# Patient Record
Sex: Male | Born: 1965
Health system: Southern US, Community
[De-identification: ages and names within clinical notes are randomized; demographics above are authoritative.]

## PROBLEM LIST (undated history)

## (undated) DIAGNOSIS — F329 Major depressive disorder, single episode, unspecified: Secondary | ICD-10-CM

## (undated) DIAGNOSIS — F419 Anxiety disorder, unspecified: Secondary | ICD-10-CM

## (undated) DIAGNOSIS — F429 Obsessive-compulsive disorder, unspecified: Secondary | ICD-10-CM

## (undated) DIAGNOSIS — L309 Dermatitis, unspecified: Secondary | ICD-10-CM

## (undated) DIAGNOSIS — Z87448 Personal history of other diseases of urinary system: Secondary | ICD-10-CM

## (undated) DIAGNOSIS — N529 Male erectile dysfunction, unspecified: Secondary | ICD-10-CM

## (undated) DIAGNOSIS — T7840XA Allergy, unspecified, initial encounter: Secondary | ICD-10-CM

## (undated) DIAGNOSIS — F32A Depression, unspecified: Secondary | ICD-10-CM

## (undated) HISTORY — DX: Male erectile dysfunction, unspecified: N52.9

## (undated) HISTORY — DX: Allergy, unspecified, initial encounter: T78.40XA

## (undated) HISTORY — PX: VASECTOMY: SHX75

## (undated) HISTORY — DX: Obsessive-compulsive disorder, unspecified: F42.9

## (undated) HISTORY — DX: Personal history of other diseases of urinary system: Z87.448

## (undated) HISTORY — DX: Dermatitis, unspecified: L30.9

## (undated) HISTORY — DX: Anxiety disorder, unspecified: F41.9

## (undated) HISTORY — DX: Depression, unspecified: F32.A

## (undated) HISTORY — DX: Major depressive disorder, single episode, unspecified: F32.9

---

## 1998-12-12 ENCOUNTER — Encounter: Payer: Self-pay | Admitting: Internal Medicine

## 1998-12-12 ENCOUNTER — Ambulatory Visit (HOSPITAL_COMMUNITY): Admission: RE | Admit: 1998-12-12 | Discharge: 1998-12-12 | Payer: Self-pay | Admitting: Internal Medicine

## 2000-07-16 ENCOUNTER — Emergency Department (HOSPITAL_COMMUNITY): Admission: EM | Admit: 2000-07-16 | Discharge: 2000-07-16 | Payer: Self-pay | Admitting: Emergency Medicine

## 2000-07-24 ENCOUNTER — Emergency Department (HOSPITAL_COMMUNITY): Admission: EM | Admit: 2000-07-24 | Discharge: 2000-07-24 | Payer: Self-pay | Admitting: Emergency Medicine

## 2000-08-31 ENCOUNTER — Emergency Department (HOSPITAL_COMMUNITY): Admission: EM | Admit: 2000-08-31 | Discharge: 2000-08-31 | Payer: Self-pay | Admitting: Emergency Medicine

## 2000-09-02 ENCOUNTER — Emergency Department (HOSPITAL_COMMUNITY): Admission: EM | Admit: 2000-09-02 | Discharge: 2000-09-02 | Payer: Self-pay | Admitting: Emergency Medicine

## 2002-08-22 ENCOUNTER — Emergency Department (HOSPITAL_COMMUNITY): Admission: EM | Admit: 2002-08-22 | Discharge: 2002-08-23 | Payer: Self-pay | Admitting: Emergency Medicine

## 2002-12-23 ENCOUNTER — Emergency Department (HOSPITAL_COMMUNITY): Admission: EM | Admit: 2002-12-23 | Discharge: 2002-12-23 | Payer: Self-pay | Admitting: Nurse Practitioner

## 2002-12-31 ENCOUNTER — Ambulatory Visit (HOSPITAL_BASED_OUTPATIENT_CLINIC_OR_DEPARTMENT_OTHER): Admission: RE | Admit: 2002-12-31 | Discharge: 2002-12-31 | Payer: Self-pay | Admitting: Internal Medicine

## 2004-10-18 ENCOUNTER — Ambulatory Visit: Payer: Self-pay | Admitting: Internal Medicine

## 2004-11-23 ENCOUNTER — Emergency Department: Payer: Self-pay | Admitting: Emergency Medicine

## 2005-01-08 ENCOUNTER — Ambulatory Visit: Payer: Self-pay | Admitting: Internal Medicine

## 2005-01-17 ENCOUNTER — Ambulatory Visit: Payer: Self-pay | Admitting: Internal Medicine

## 2005-01-30 ENCOUNTER — Ambulatory Visit: Payer: Self-pay | Admitting: Internal Medicine

## 2005-02-28 ENCOUNTER — Ambulatory Visit: Payer: Self-pay | Admitting: Gastroenterology

## 2005-03-16 ENCOUNTER — Ambulatory Visit: Payer: Self-pay | Admitting: Gastroenterology

## 2005-03-20 ENCOUNTER — Encounter: Admission: RE | Admit: 2005-03-20 | Discharge: 2005-03-20 | Payer: Self-pay | Admitting: Gastroenterology

## 2005-03-30 ENCOUNTER — Ambulatory Visit (HOSPITAL_COMMUNITY): Admission: RE | Admit: 2005-03-30 | Discharge: 2005-03-30 | Payer: Self-pay | Admitting: Gastroenterology

## 2005-04-17 ENCOUNTER — Ambulatory Visit: Payer: Self-pay | Admitting: Gastroenterology

## 2005-04-30 ENCOUNTER — Ambulatory Visit: Payer: Self-pay | Admitting: Internal Medicine

## 2005-08-13 ENCOUNTER — Emergency Department: Payer: Self-pay | Admitting: General Practice

## 2005-10-09 ENCOUNTER — Ambulatory Visit: Payer: Self-pay | Admitting: Internal Medicine

## 2006-08-07 ENCOUNTER — Ambulatory Visit: Payer: Self-pay | Admitting: Internal Medicine

## 2006-10-29 ENCOUNTER — Ambulatory Visit: Payer: Self-pay | Admitting: Internal Medicine

## 2006-12-23 ENCOUNTER — Ambulatory Visit: Payer: Self-pay | Admitting: Internal Medicine

## 2007-01-28 ENCOUNTER — Ambulatory Visit: Payer: Self-pay | Admitting: Internal Medicine

## 2007-03-18 ENCOUNTER — Ambulatory Visit: Payer: Self-pay | Admitting: Internal Medicine

## 2007-04-30 ENCOUNTER — Ambulatory Visit: Payer: Self-pay | Admitting: Internal Medicine

## 2007-06-06 DIAGNOSIS — F411 Generalized anxiety disorder: Secondary | ICD-10-CM | POA: Insufficient documentation

## 2007-06-06 DIAGNOSIS — G4700 Insomnia, unspecified: Secondary | ICD-10-CM | POA: Insufficient documentation

## 2007-06-06 DIAGNOSIS — F3289 Other specified depressive episodes: Secondary | ICD-10-CM | POA: Insufficient documentation

## 2007-06-06 DIAGNOSIS — F329 Major depressive disorder, single episode, unspecified: Secondary | ICD-10-CM

## 2007-06-06 DIAGNOSIS — F419 Anxiety disorder, unspecified: Secondary | ICD-10-CM | POA: Insufficient documentation

## 2007-06-06 DIAGNOSIS — F4310 Post-traumatic stress disorder, unspecified: Secondary | ICD-10-CM | POA: Insufficient documentation

## 2007-06-06 DIAGNOSIS — B009 Herpesviral infection, unspecified: Secondary | ICD-10-CM | POA: Insufficient documentation

## 2007-06-06 DIAGNOSIS — B350 Tinea barbae and tinea capitis: Secondary | ICD-10-CM | POA: Insufficient documentation

## 2007-06-06 DIAGNOSIS — K589 Irritable bowel syndrome without diarrhea: Secondary | ICD-10-CM | POA: Insufficient documentation

## 2007-12-02 ENCOUNTER — Ambulatory Visit: Payer: Self-pay | Admitting: Internal Medicine

## 2007-12-02 DIAGNOSIS — B079 Viral wart, unspecified: Secondary | ICD-10-CM | POA: Insufficient documentation

## 2007-12-02 DIAGNOSIS — M6740 Ganglion, unspecified site: Secondary | ICD-10-CM | POA: Insufficient documentation

## 2008-02-06 ENCOUNTER — Ambulatory Visit: Payer: Self-pay | Admitting: Internal Medicine

## 2008-02-06 LAB — CONVERTED CEMR LAB
ALT: 19 units/L (ref 0–53)
AST: 23 units/L (ref 0–37)
Albumin: 3.9 g/dL (ref 3.5–5.2)
Alkaline Phosphatase: 51 units/L (ref 39–117)
BUN: 8 mg/dL (ref 6–23)
Basophils Absolute: 0 10*3/uL (ref 0.0–0.1)
Basophils Relative: 0.6 % (ref 0.0–1.0)
Bilirubin Urine: NEGATIVE
Bilirubin, Direct: 0.3 mg/dL (ref 0.0–0.3)
CO2: 31 meq/L (ref 19–32)
Calcium: 9 mg/dL (ref 8.4–10.5)
Chloride: 104 meq/L (ref 96–112)
Cholesterol: 171 mg/dL (ref 0–200)
Creatinine, Ser: 0.7 mg/dL (ref 0.4–1.5)
Eosinophils Absolute: 0.1 10*3/uL (ref 0.0–0.6)
Eosinophils Relative: 3 % (ref 0.0–5.0)
GFR calc Af Amer: 158 mL/min
GFR calc non Af Amer: 131 mL/min
Glucose, Bld: 80 mg/dL (ref 70–99)
HCT: 42.5 % (ref 39.0–52.0)
HDL: 68.4 mg/dL (ref >39.0)
Hemoglobin, Urine: NEGATIVE
Hemoglobin: 14.1 g/dL (ref 13.0–17.0)
Ketones, ur: NEGATIVE mg/dL
LDL Cholesterol: 92 mg/dL (ref 0–99)
Leukocytes, UA: NEGATIVE
Lymphocytes Relative: 53.3 % — ABNORMAL HIGH (ref 12.0–46.0)
MCHC: 33.1 g/dL (ref 30.0–36.0)
MCV: 91.8 fL (ref 78.0–100.0)
Monocytes Absolute: 0.3 10*3/uL (ref 0.2–0.7)
Monocytes Relative: 9.5 % (ref 3.0–11.0)
Neutro Abs: 1.1 10*3/uL — ABNORMAL LOW (ref 1.4–7.7)
Neutrophils Relative %: 33.6 % — ABNORMAL LOW (ref 43.0–77.0)
Nitrite: NEGATIVE
PSA: 0.46 ng/mL (ref 0.10–4.00)
Platelets: 255 10*3/uL (ref 150–400)
Potassium: 3.6 meq/L (ref 3.5–5.1)
RBC: 4.63 M/uL (ref 4.22–5.81)
RDW: 12.1 % (ref 11.5–14.6)
Sodium: 140 meq/L (ref 135–145)
Specific Gravity, Urine: 1.015 (ref 1.000–1.03)
TSH: 0.79 microintl units/mL (ref 0.35–5.50)
Total Bilirubin: 3.3 mg/dL — ABNORMAL HIGH (ref 0.3–1.2)
Total CHOL/HDL Ratio: 2.5
Total Protein, Urine: NEGATIVE mg/dL
Total Protein: 7.5 g/dL (ref 6.0–8.3)
Triglycerides: 53 mg/dL (ref 0–149)
Urine Glucose: NEGATIVE mg/dL
Urobilinogen, UA: 2 (ref 0.0–1.0)
VLDL: 11 mg/dL (ref 0–40)
WBC: 3.4 10*3/uL — ABNORMAL LOW (ref 4.5–10.5)
pH: 7 (ref 5.0–8.0)

## 2008-02-09 ENCOUNTER — Ambulatory Visit: Payer: Self-pay | Admitting: Internal Medicine

## 2008-02-09 DIAGNOSIS — R17 Unspecified jaundice: Secondary | ICD-10-CM | POA: Insufficient documentation

## 2008-02-12 ENCOUNTER — Encounter: Payer: Self-pay | Admitting: Internal Medicine

## 2008-05-25 ENCOUNTER — Ambulatory Visit: Payer: Self-pay | Admitting: Internal Medicine

## 2008-07-26 ENCOUNTER — Ambulatory Visit: Payer: Self-pay | Admitting: Internal Medicine

## 2008-11-02 ENCOUNTER — Ambulatory Visit: Payer: Self-pay | Admitting: Internal Medicine

## 2008-11-02 DIAGNOSIS — L259 Unspecified contact dermatitis, unspecified cause: Secondary | ICD-10-CM | POA: Insufficient documentation

## 2009-04-12 ENCOUNTER — Ambulatory Visit: Payer: Self-pay | Admitting: Internal Medicine

## 2009-07-15 IMAGING — CR DG CHEST 2V
2 series · 2 of 2 positions shown · non-contrast
Comparison: None

CLINICAL DATA: Wellness visit, cough, nonsmoker

CHEST - 2 VIEW

[view not recorded (1 of 2)]
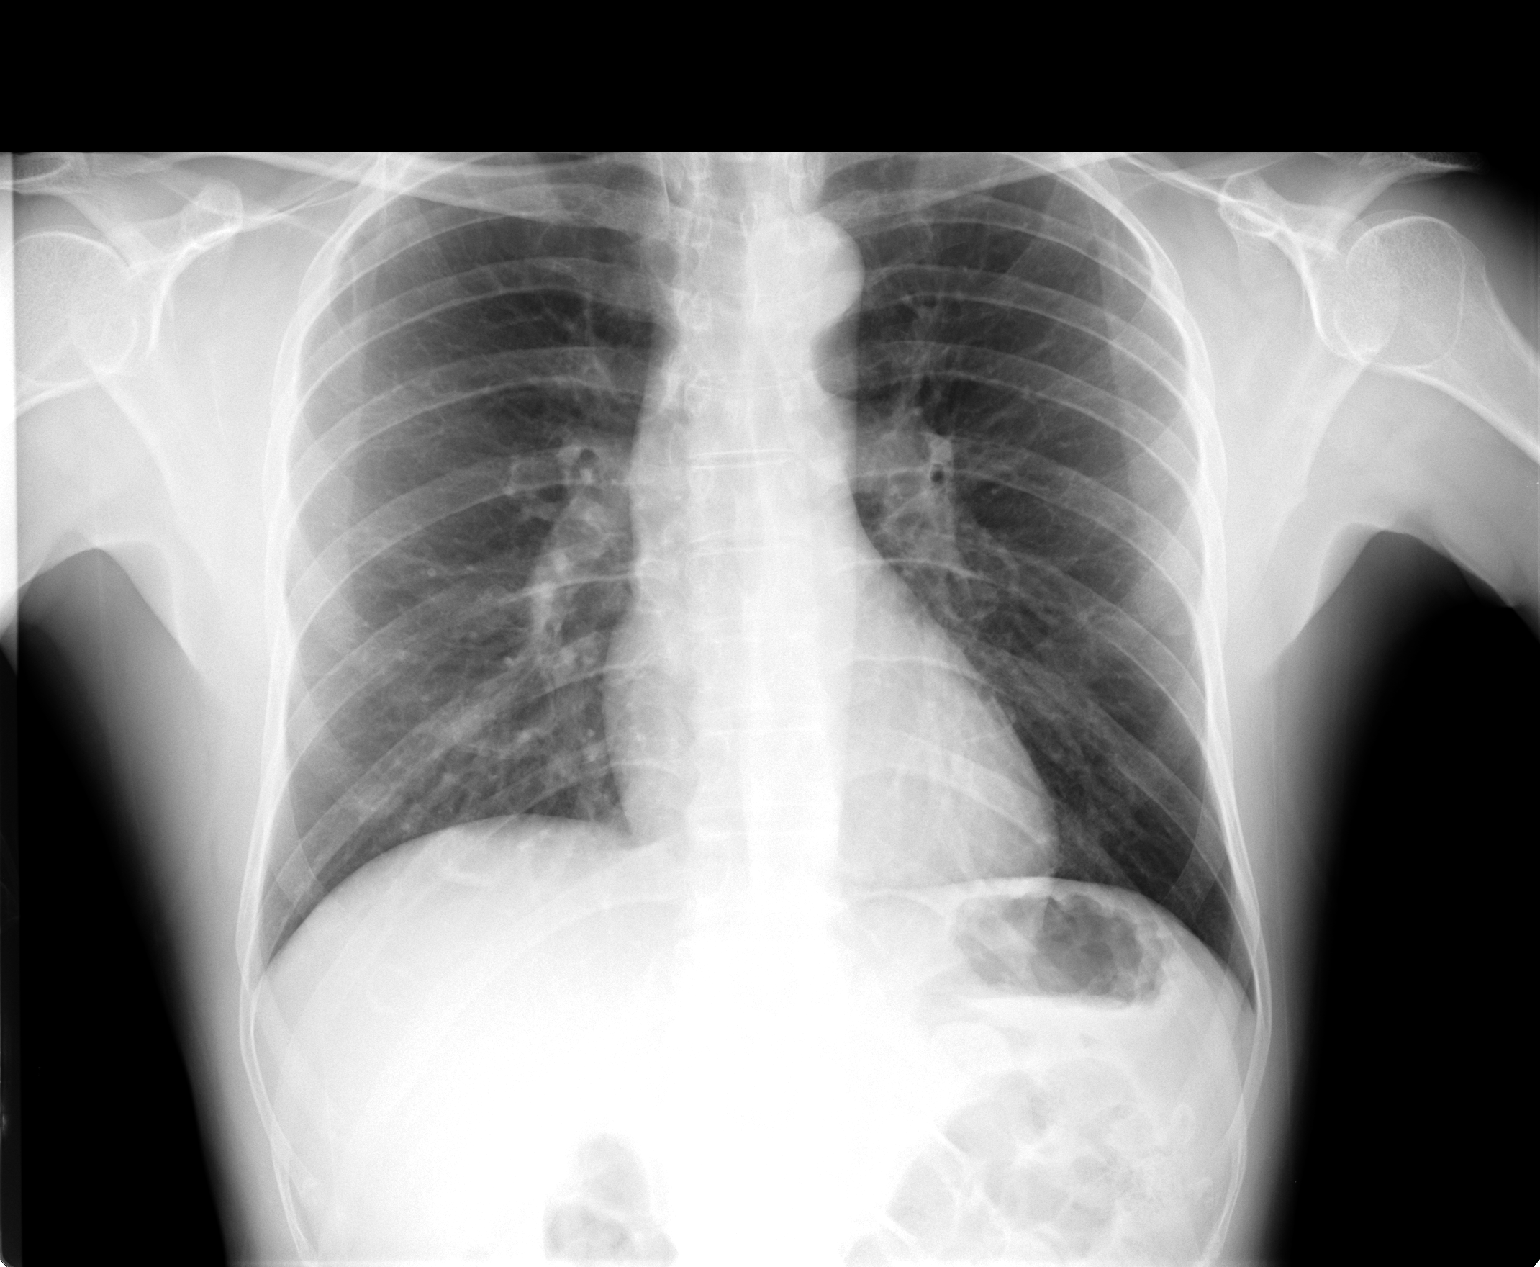

[view not recorded (2 of 2)]
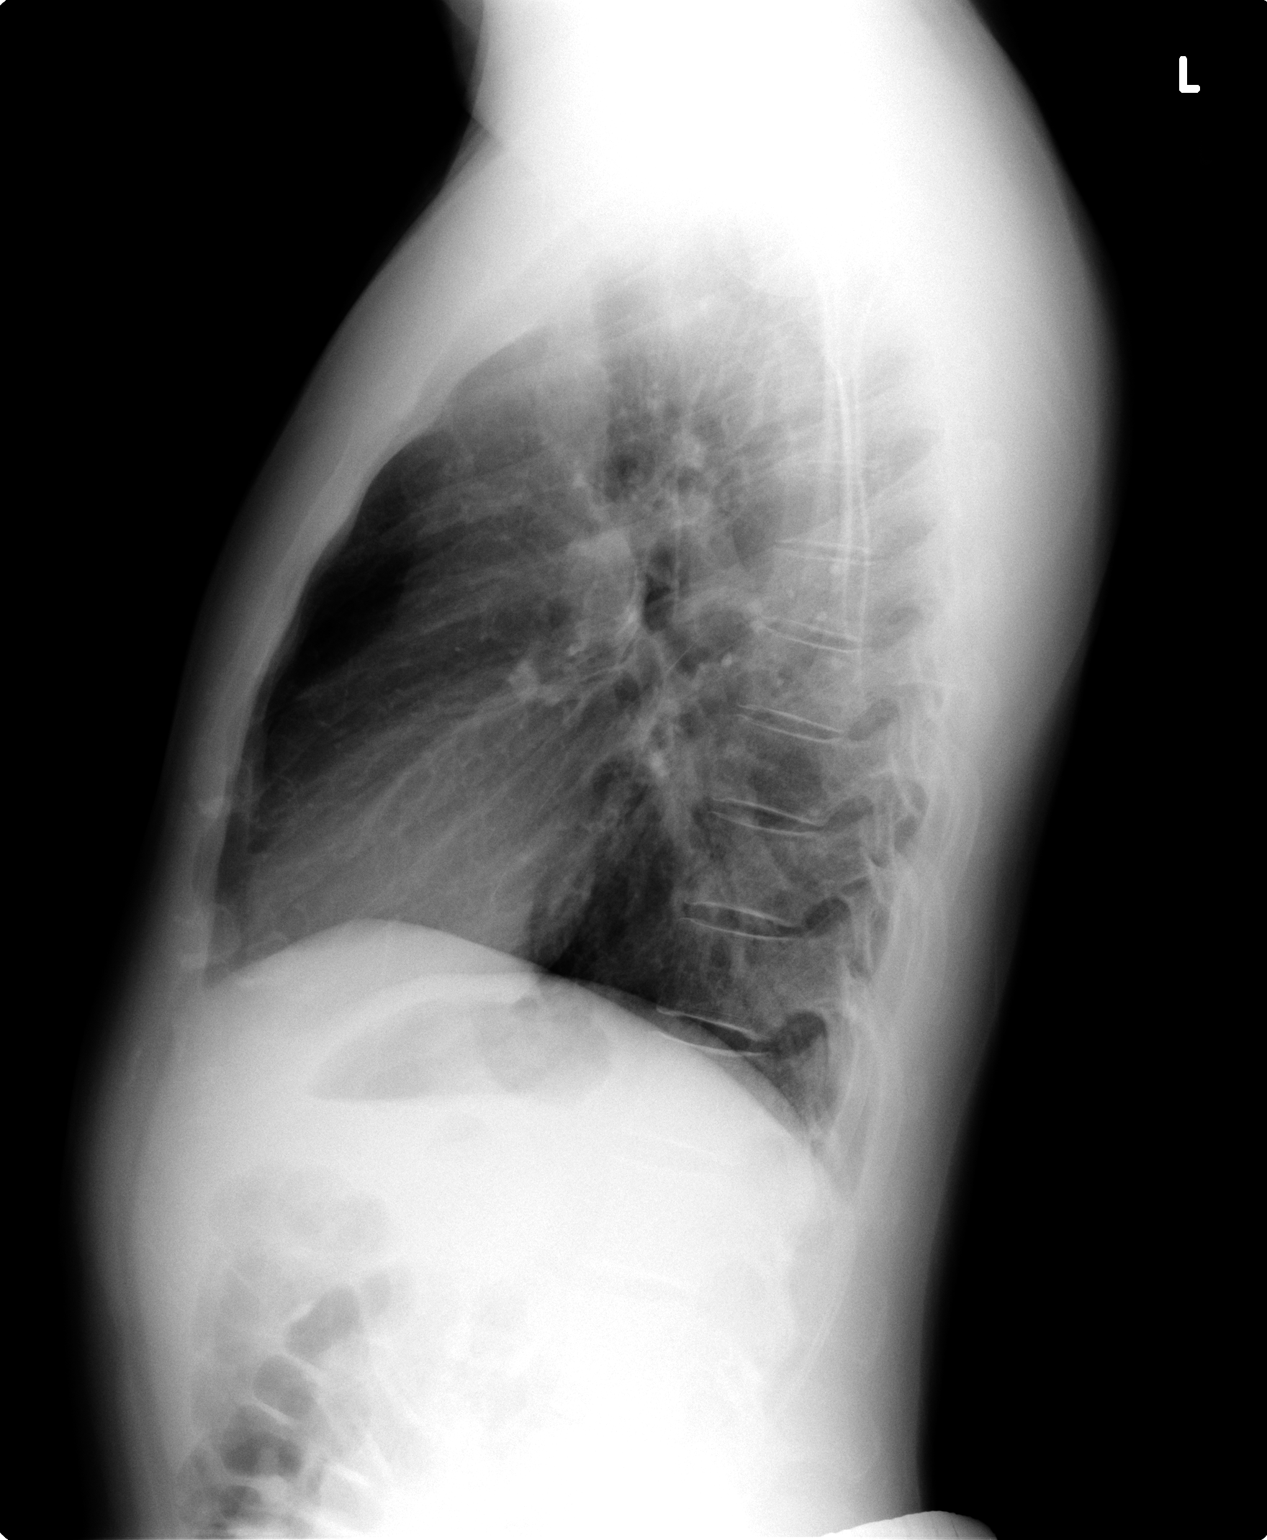

[2 of 2 positions shown; findings below may reference images not displayed]

FINDINGS: The cardiomediastinal silhouette is unremarkable.  There
is no acute infiltrate or pleural effusion.  Bony structures are
unremarkable.
IMPRESSION: No active chest disease.

## 2010-03-08 ENCOUNTER — Ambulatory Visit: Payer: Self-pay | Admitting: Internal Medicine

## 2010-03-08 DIAGNOSIS — J309 Allergic rhinitis, unspecified: Secondary | ICD-10-CM | POA: Insufficient documentation

## 2010-03-14 ENCOUNTER — Encounter: Payer: Self-pay | Admitting: Internal Medicine

## 2010-07-10 ENCOUNTER — Ambulatory Visit: Payer: Self-pay | Admitting: Internal Medicine

## 2010-07-12 LAB — CONVERTED CEMR LAB
ALT: 14 units/L (ref 0–53)
AST: 19 units/L (ref 0–37)
Albumin: 4.3 g/dL (ref 3.5–5.2)
Alkaline Phosphatase: 57 units/L (ref 39–117)
BUN: 13 mg/dL (ref 6–23)
Basophils Absolute: 0 10*3/uL (ref 0.0–0.1)
Basophils Relative: 0.7 % (ref 0.0–3.0)
Bilirubin Urine: NEGATIVE
Bilirubin, Direct: 0.3 mg/dL (ref 0.0–0.3)
CO2: 30 meq/L (ref 19–32)
Calcium: 9.2 mg/dL (ref 8.4–10.5)
Chloride: 102 meq/L (ref 96–112)
Cholesterol: 180 mg/dL (ref 0–200)
Creatinine, Ser: 0.8 mg/dL (ref 0.4–1.5)
Eosinophils Absolute: 0.3 10*3/uL (ref 0.0–0.7)
Eosinophils Relative: 6 % — ABNORMAL HIGH (ref 0.0–5.0)
GFR calc non Af Amer: 139.66 mL/min (ref >60)
Glucose, Bld: 75 mg/dL (ref 70–99)
HCT: 40.1 % (ref 39.0–52.0)
HDL: 74.4 mg/dL (ref >39.00)
Hemoglobin, Urine: NEGATIVE
Hemoglobin: 13.5 g/dL (ref 13.0–17.0)
Ketones, ur: NEGATIVE mg/dL
LDL Cholesterol: 82 mg/dL (ref 0–99)
Leukocytes, UA: NEGATIVE
Lymphocytes Relative: 43.4 % (ref 12.0–46.0)
Lymphs Abs: 2.2 10*3/uL (ref 0.7–4.0)
MCHC: 33.7 g/dL (ref 30.0–36.0)
MCV: 95 fL (ref 78.0–100.0)
Monocytes Absolute: 0.4 10*3/uL (ref 0.1–1.0)
Monocytes Relative: 8.6 % (ref 3.0–12.0)
Neutro Abs: 2 10*3/uL (ref 1.4–7.7)
Neutrophils Relative %: 41.3 % — ABNORMAL LOW (ref 43.0–77.0)
Nitrite: NEGATIVE
PSA: 0.48 ng/mL (ref 0.10–4.00)
Platelets: 228 10*3/uL (ref 150.0–400.0)
Potassium: 3.8 meq/L (ref 3.5–5.1)
RBC: 4.22 M/uL (ref 4.22–5.81)
RDW: 12.3 % (ref 11.5–14.6)
Sodium: 140 meq/L (ref 135–145)
Specific Gravity, Urine: >=1.03 (ref 1.000–1.030)
TSH: 0.96 microintl units/mL (ref 0.35–5.50)
Testosterone: 412.57 ng/dL (ref 350.00–890.00)
Total Bilirubin: 2.1 mg/dL — ABNORMAL HIGH (ref 0.3–1.2)
Total CHOL/HDL Ratio: 2
Total Protein, Urine: NEGATIVE mg/dL
Total Protein: 7.4 g/dL (ref 6.0–8.3)
Triglycerides: 118 mg/dL (ref 0.0–149.0)
Urine Glucose: NEGATIVE mg/dL
Urobilinogen, UA: 1 (ref 0.0–1.0)
VLDL: 23.6 mg/dL (ref 0.0–40.0)
WBC: 5 10*3/uL (ref 4.5–10.5)
pH: 6 (ref 5.0–8.0)

## 2010-08-15 ENCOUNTER — Ambulatory Visit: Payer: Self-pay | Admitting: Internal Medicine

## 2010-08-15 DIAGNOSIS — R319 Hematuria, unspecified: Secondary | ICD-10-CM | POA: Insufficient documentation

## 2010-08-16 LAB — CONVERTED CEMR LAB
Bilirubin Urine: NEGATIVE
Hemoglobin, Urine: NEGATIVE
Ketones, ur: NEGATIVE mg/dL
Leukocytes, UA: NEGATIVE
Nitrite: NEGATIVE
Specific Gravity, Urine: 1.02 (ref 1.000–1.030)
Total Protein, Urine: NEGATIVE mg/dL
Urine Glucose: NEGATIVE mg/dL
Urobilinogen, UA: 4 (ref 0.0–1.0)
pH: 7 (ref 5.0–8.0)

## 2010-09-06 ENCOUNTER — Encounter: Payer: Self-pay | Admitting: Internal Medicine

## 2010-12-03 ENCOUNTER — Encounter: Payer: Self-pay | Admitting: Gastroenterology

## 2010-12-14 NOTE — Consult Note (Signed)
Summary: [facility]  [facility]   Imported By: Sherian Rein 03/24/2010 08:23:42  _____________________________________________________________________  External Attachment:    Type:   Image     Comment:   External Document

## 2010-12-14 NOTE — Assessment & Plan Note (Signed)
Summary: cpx-lb   Vital Signs:  Patient profile:   45 year old male Height:      66 inches Weight:      134 pounds BMI:     21.71 O2 Sat:      97 % on Room air Temp:     98.4 degrees F oral Pulse rate:   66 / minute BP sitting:   102 / 78  (left arm) Cuff size:   regular  Vitals Entered By: Bill Salinas CMA (July 10, 2010 3:15 PM)  O2 Flow:  Room air CC: cpx/ab   CC:  cpx/ab.  History of Present Illness: The patient presents for a preventive health examination   Current Medications (verified): 1)  Lorazepam 0.5 Mg Tabs (Lorazepam) .Marland Kitchen.. 1 By Mouth Two Times A Day As Needed Anxiety 2)  Vitamin D3 1000 Unit  Tabs (Cholecalciferol) .Marland Kitchen.. 1 By Mouth Daily 3)  Denavir 1 % Crea (Penciclovir) .... Use 5 Times A Day X 5 D Prn 4)  Triamcinolone 0.5% Cream in Eucerin Lotion 1:10 .... Use Two Times A Day Prn 5)  Viagra 50 Mg Tabs (Sildenafil Citrate) .... 1/2-1 By Mouth Once Daily Prn  Allergies (verified): 1)  ! Paxil 2)  ! Aspirin (Aspirin) 3)  Levitra (Vardenafil Hcl)  Past History:  Past Medical History: Last updated: 03/08/2010 Anxiety Depression/OCD Eczema Allergic rhinitis ED  Family History: Last updated: 02/09/2008 Prostate CA  Past Surgical History: Denies surgical history  Family History: Reviewed history from 02/09/2008 and no changes required. Prostate CA  Social History: Reviewed history from 04/12/2009 and no changes required. Married, separated 2010; divorcing in Oct 2011  2 children Never Smoked Alcohol use-no Occupation:  Review of Systems  The patient denies anorexia, fever, weight loss, weight gain, vision loss, decreased hearing, hoarseness, chest pain, syncope, dyspnea on exertion, peripheral edema, prolonged cough, headaches, hemoptysis, abdominal pain, melena, hematochezia, severe indigestion/heartburn, hematuria, incontinence, genital sores, muscle weakness, suspicious skin lesions, transient blindness, difficulty walking, depression,  unusual weight change, abnormal bleeding, enlarged lymph nodes, angioedema, and testicular masses.         mild ED  Physical Exam  General:  Well-developed,well-nourished,in no acute distress; alert and cooperative throughout examination. No body odor present Head:  Normocephalic and atraumatic without obvious abnormalities. No apparent alopecia or balding. Ears:  wax B Nose:  External nasal examination shows no deformity or inflammation. Nasal mucosa are pink and moist without lesions or exudates. Mouth:  Oral mucosa and oropharynx without lesions or exudates.  Teeth in good repair. Neck:  No deformities, masses, or tenderness noted. Lungs:  Normal respiratory effort, chest expands symmetrically. Lungs are clear to auscultation, no crackles or wheezes. Heart:  Normal rate and regular rhythm. S1 and S2 normal without gallop, murmur, click, rub or other extra sounds. Abdomen:  Bowel sounds positive,abdomen soft and non-tender without masses, organomegaly or hernias noted. Genitalia:  Testes bilaterally descended without nodularity, tenderness or masses. No scrotal masses or lesions. No penis lesions or urethral discharge. Msk:  No deformity or scoliosis noted of thoracic or lumbar spine.   Neurologic:  No cranial nerve deficits noted. Station and gait are normal. Plantar reflexes are down-going bilaterally. DTRs are symmetrical throughout. Sensory, motor and coordinative functions appear intact. Skin:  dry with eczem. patches Cervical Nodes:  No lymphadenopathy noted Psych:  Oriented X3, subdued, and slightly anxious.  Not suicidal or homicidal   Impression & Recommendations:  Problem # 1:  WELL ADULT EXAM (ICD-V70.0) Assessment New Health and  age related issues were discussed. Available screening tests and vaccinations were discussed as well. Healthy life style including good diet and exercise was discussed.  Orders: TLB-BMP (Basic Metabolic Panel-BMET) (80048-METABOL) TLB-CBC Platelet  - w/Differential (85025-CBCD) TLB-Hepatic/Liver Function Pnl (80076-HEPATIC) TLB-Lipid Panel (80061-LIPID) TLB-TSH (Thyroid Stimulating Hormone) (84443-TSH) TLB-Udip ONLY (81003-UDIP) TLB-PSA (Prostate Specific Antigen) (84153-PSA)  Problem # 2:  ANXIETY (ICD-300.00) Assessment: Unchanged  His updated medication list for this problem includes:    Lorazepam 0.5 Mg Tabs (Lorazepam) .Marland Kitchen... 1 by mouth two times a day as needed anxiety  Problem # 3:  ERECTILE DYSFUNCTION (ICD-607.84) mild Assessment: Unchanged  His updated medication list for this problem includes:    Viagra 50 Mg Tabs (Sildenafil citrate) .Marland Kitchen... 1/2-1 by mouth once daily prn  Orders: TLB-Testosterone, Total (84403-TESTO)  Complete Medication List: 1)  Lorazepam 0.5 Mg Tabs (Lorazepam) .Marland Kitchen.. 1 by mouth two times a day as needed anxiety 2)  Vitamin D3 1000 Unit Tabs (Cholecalciferol) .Marland Kitchen.. 1 by mouth daily 3)  Denavir 1 % Crea (Penciclovir) .... Use 5 times a day x 5 d prn 4)  Triamcinolone 0.5% Cream in Eucerin Lotion 1:10  .... Use two times a day prn 5)  Viagra 50 Mg Tabs (Sildenafil citrate) .... 1/2-1 by mouth once daily prn  Other Orders: Admin 1st Vaccine (16109) Flu Vaccine 31yrs + (60454)  Patient Instructions: 1)  Please schedule a follow-up appointment in 6 months. Prescriptions: VITAMIN D3 1000 UNIT  TABS (CHOLECALCIFEROL) 1 by mouth daily  #100 x 3   Entered and Authorized by:   Tresa Garter MD   Signed by:   Tresa Garter MD on 07/10/2010   Method used:   Print then Give to Patient   RxID:   [id number] VIAGRA 50 MG TABS (SILDENAFIL CITRATE) 1/2-1 by mouth once daily prn  #12 x 3   Entered and Authorized by:   Tresa Garter MD   Signed by:   Tresa Garter MD on 07/10/2010   Method used:   Print then Give to Patient   RxID:   [id number] TRIAMCINOLONE 0.5% CREAM IN EUCERIN LOTION 1:10 use two times a day prn  #500 g x 3   Entered and Authorized by:   Tresa Garter MD   Signed by:   Tresa Garter MD on 07/10/2010   Method used:   Print then Give to Patient   RxID:   [id number] DENAVIR 1 % CREA (PENCICLOVIR) use 5 times a day x 5 d prn  #15 g x 3   Entered and Authorized by:   Tresa Garter MD   Signed by:   Tresa Garter MD on 07/10/2010   Method used:   Print then Give to Patient   RxID:   [id number] LORAZEPAM 0.5 MG TABS (LORAZEPAM) 1 by mouth two times a day as needed anxiety  #60 x 3   Entered and Authorized by:   Tresa Garter MD   Signed by:   Tresa Garter MD on 07/10/2010   Method used:   Print then Give to Patient   RxID:   [id number]    Preventive Care Screening  Last Tetanus Booster:    Date:  01/11/2004    Results:  Historical       Flu Vaccine Consent Questions     Do you have a history of severe allergic reactions to this vaccine? no    Any prior history of allergic reactions to egg and/or gelatin?  no    Do you have a sensitivity to the preservative Thimersol? no    Do you have a past history of Guillan-Barre Syndrome? no    Do you currently have an acute febrile illness? no    Have you ever had a severe reaction to latex? no    Vaccine information given and explained to patient? yes    Are you currently pregnant? no    Lot Number:[id number]   Exp Date:05/12/2011   Site Given  Left Deltoid IMflu

## 2010-12-14 NOTE — Letter (Signed)
Summary: [facility]  [facility]   Imported By: Lennie Odor 09/27/2010 11:35:57  _____________________________________________________________________  External Attachment:    Type:   Image     Comment:   External Document

## 2010-12-14 NOTE — Assessment & Plan Note (Signed)
Summary: BLADDER INFECTION? /NWS #   Vital Signs:  Patient profile:   45 year old male Height:      66 inches Weight:      135 pounds BMI:     21.87 Temp:     99.0 degrees F oral Pulse rate:   84 / minute Pulse rhythm:   regular BP sitting:   110 / 80  (left arm) Cuff size:   regular  Vitals Entered By: Lanier Prude, Beverly Gust) (August 15, 2010 4:09 PM) CC: hematuria, abd pain x 1 day Is Patient Diabetic? No Comments pt states he has not filled any of the RX's he was given in august   CC:  hematuria and abd pain x 1 day.  History of Present Illness: C/o blood in urine x 2 yesterday.  Last week he had pain in back. 2 wks ago had n/v  Current Medications (verified): 1)  Lorazepam 0.5 Mg Tabs (Lorazepam) .Marland Kitchen.. 1 By Mouth Two Times A Day As Needed Anxiety 2)  Vitamin D3 1000 Unit  Tabs (Cholecalciferol) .Marland Kitchen.. 1 By Mouth Daily 3)  Denavir 1 % Crea (Penciclovir) .... Use 5 Times A Day X 5 D Prn 4)  Triamcinolone 0.5% Cream in Eucerin Lotion 1:10 .... Use Two Times A Day Prn 5)  Viagra 50 Mg Tabs (Sildenafil Citrate) .... 1/2-1 By Mouth Once Daily Prn  Allergies (verified): 1)  ! Paxil 2)  ! Aspirin (Aspirin) 3)  Levitra (Vardenafil Hcl)  Past History:  Social History: Last updated: 07/10/2010 Married, separated 2010; divorcing in Oct 2011  2 children Never Smoked Alcohol use-no Occupation:  Past Medical History: Anxiety Depression/OCD Eczema Allergic rhinitis ED H/o hematuria  Review of Systems  The patient denies fever, abdominal pain, and genital sores.    Physical Exam  General:  Well-developed,well-nourished,in no acute distress; alert and cooperative throughout examination. No body odor present Mouth:  Oral mucosa and oropharynx without lesions or exudates.  Teeth in good repair. Lungs:  Normal respiratory effort, chest expands symmetrically. Lungs are clear to auscultation, no crackles or wheezes. Heart:  Normal rate and regular rhythm. S1 and S2  normal without gallop, murmur, click, rub or other extra sounds. Abdomen:  Bowel sounds positive,abdomen soft and non-tender without masses, organomegaly or hernias noted. Msk:  No deformity or scoliosis noted of thoracic or lumbar spine.   Neurologic:  No cranial nerve deficits noted. Station and gait are normal. Plantar reflexes are down-going bilaterally. DTRs are symmetrical throughout. Sensory, motor and coordinative functions appear intact.   Impression & Recommendations:  Problem # 1:  HEMATURIA UNSPECIFIED (ICD-599.70), macro Assessment New  Orders: TLB-Udip ONLY (81003-UDIP) Urology Referral (Urology)  His updated medication list for this problem includes:    Ciprofloxacin Hcl 500 Mg Tabs (Ciprofloxacin hcl) .Marland Kitchen... 1 by mouth bid  Complete Medication List: 1)  Lorazepam 0.5 Mg Tabs (Lorazepam) .Marland Kitchen.. 1 by mouth two times a day as needed anxiety 2)  Vitamin D3 1000 Unit Tabs (Cholecalciferol) .Marland Kitchen.. 1 by mouth daily 3)  Denavir 1 % Crea (Penciclovir) .... Use 5 times a day x 5 d prn 4)  Triamcinolone 0.5% Cream in Eucerin Lotion 1:10  .... Use two times a day prn 5)  Viagra 50 Mg Tabs (Sildenafil citrate) .... 1/2-1 by mouth once daily prn 6)  Ciprofloxacin Hcl 500 Mg Tabs (Ciprofloxacin hcl) .Marland Kitchen.. 1 by mouth bid  Patient Instructions: 1)  Call if you are not better in a reasonable amount of time or if worse. Go  to ER if feeling really bad!  Prescriptions: CIPROFLOXACIN HCL 500 MG TABS (CIPROFLOXACIN HCL) 1 by mouth bid  #20 x 1   Entered and Authorized by:   Tresa Garter MD   Signed by:   Tresa Garter MD on 08/15/2010   Method used:   Print then Give to Patient   RxID:   [id number]

## 2010-12-14 NOTE — Assessment & Plan Note (Signed)
Summary: HEAD ACHES--NAUSEA-SINUS PROBLEM-REFUSED ANOTHER MD--STC   Vital Signs:  Patient profile:   45 year old male Height:      66 inches Weight:      131 pounds BMI:     21.22 O2 Sat:      96 % on Room air Temp:     98.3 degrees F oral Pulse rate:   88 / minute BP sitting:   110 / 60  (left arm) Cuff size:   regular  Vitals Entered By: Lucious Groves (March 08, 2010 3:22 PM)  O2 Flow:  Room air CC: C/o headache, nausea, and mucous producing sinus issues "for awhile now"./kb Is Patient Diabetic? No Pain Assessment Patient in pain? no        CC:  C/o headache, nausea, and and mucous producing sinus issues "for awhile now"./kb.  History of Present Illness: C/o sinus congestion, drainage, bad breath C/o mild ED - soft erection at times  Current Medications (verified): 1)  Lorazepam 0.5 Mg Tabs (Lorazepam) .Marland Kitchen.. 1 By Mouth Two Times A Day As Needed Anxiety 2)  Vitamin D3 1000 Unit  Tabs (Cholecalciferol) .Marland Kitchen.. 1 By Mouth Daily 3)  Denavir 1 % Crea (Penciclovir) .... Use 5 Times A Day X 5 D Prn 4)  Triamcinolone 0.5% Cream in Eucerin Lotion 1:10 .... Use Two Times A Day Prn 5)  Paxil Cr 12.5 Mg Xr24h-Tab (Paroxetine Hcl) .Marland Kitchen.. 1 By Mouth Qd  Allergies (verified): 1)  ! Paxil 2)  ! Aspirin (Aspirin) 3)  Levitra (Vardenafil Hcl)  Past History:  Family History: Last updated: 02/09/2008 Prostate CA  Social History: Last updated: 04/12/2009 Married, separated 2010;  2 children Never Smoked Alcohol use-no Occupation:  Past Medical History: Anxiety Depression/OCD Eczema Allergic rhinitis ED  Review of Systems       The patient complains of depression.  The patient denies fever, dyspnea on exertion, peripheral edema, and abdominal pain.    Physical Exam  General:  Well-developed,well-nourished,in no acute distress; alert and cooperative throughout examination. No body odor present Head:  Normocephalic and atraumatic without obvious abnormalities. No apparent  alopecia or balding. Ears:  wax B Nose:  External nasal examination shows no deformity or inflammation. Nasal mucosa are pink and moist without lesions or exudates. Mouth:  Oral mucosa and oropharynx without lesions or exudates.  Teeth in good repair. Lungs:  Normal respiratory effort, chest expands symmetrically. Lungs are clear to auscultation, no crackles or wheezes. Heart:  Normal rate and regular rhythm. S1 and S2 normal without gallop, murmur, click, rub or other extra sounds. Abdomen:  Bowel sounds positive,abdomen soft and non-tender without masses, organomegaly or hernias noted. Genitalia:  Testes bilaterally descended without nodularity, tenderness or masses. No scrotal masses or lesions. No penis lesions or urethral discharge. Msk:  No deformity or scoliosis noted of thoracic or lumbar spine.   Neurologic:  No cranial nerve deficits noted. Station and gait are normal. Plantar reflexes are down-going bilaterally. DTRs are symmetrical throughout. Sensory, motor and coordinative functions appear intact. Skin:  dry with eczem. patches Psych:  Oriented X3, subdued, and slightly anxious.  Not suicidal or homicidal   Impression & Recommendations:  Problem # 1:  HERPES SIMPLEX INFECTION (ICD-054.9) Assessment Comment Only On Rx prn  Problem # 2:  ANXIETY (ICD-300.00)/OCD Assessment: Unchanged  The following medications were removed from the medication list:    Paxil Cr 12.5 Mg Xr24h-tab (Paroxetine hcl) .Marland Kitchen... 1 by mouth qd His updated medication list for this problem includes:  Lorazepam 0.5 Mg Tabs (Lorazepam) .Marland Kitchen... 1 by mouth two times a day as needed anxiety  Problem # 3:  ERECTILE DYSFUNCTION (ICD-607.84) mild Assessment: Unchanged  His updated medication list for this problem includes:    Viagra 50 Mg Tabs (Sildenafil citrate) ...Marland KitchenMarland Kitchen1/4 - 1/2-1 by mouth once daily as needed  Problem # 4:  CERUMEN IMPACTION (ICD-380.4) Assessment: New Removed w/a loop  Problem # 5:   ALLERGIC RHINITIS (ICD-477.9) Assessment: Deteriorated  Orders: ENT Referral (ENT)  Complete Medication List: 1)  Lorazepam 0.5 Mg Tabs (Lorazepam) .Marland Kitchen.. 1 by mouth two times a day as needed anxiety 2)  Vitamin D3 1000 Unit Tabs (Cholecalciferol) .Marland Kitchen.. 1 by mouth daily 3)  Denavir 1 % Crea (Penciclovir) .... Use 5 times a day x 5 d prn 4)  Triamcinolone 0.5% Cream in Eucerin Lotion 1:10  .... Use two times a day prn 5)  Viagra 50 Mg Tabs (Sildenafil citrate) .... 1/2-1 by mouth once daily prn  Patient Instructions: 1)  Use the Sinus rinse as needed 2)  Please schedule a follow-up appointment in 4 months well w/labs v70.0. Prescriptions: VIAGRA 50 MG TABS (SILDENAFIL CITRATE) 1/2-1 by mouth once daily prn  #12 x 3   Entered and Authorized by:   Tresa Garter MD   Signed by:   Tresa Garter MD on 03/08/2010   Method used:   Print then Give to Patient   RxID:   [id number] PAXIL CR 12.5 MG XR24H-TAB (PAROXETINE HCL) 1 by mouth qd  #30 x 6   Entered and Authorized by:   Tresa Garter MD   Signed by:   Tresa Garter MD on 03/08/2010   Method used:   Print then Give to Patient   RxID:   [id number] DENAVIR 1 % CREA (PENCICLOVIR) use 5 times a day x 5 d prn  #15 g x 3   Entered and Authorized by:   Tresa Garter MD   Signed by:   Tresa Garter MD on 03/08/2010   Method used:   Print then Give to Patient   RxID:   [id number] LORAZEPAM 0.5 MG TABS (LORAZEPAM) 1 by mouth two times a day as needed anxiety  #60 x 3   Entered and Authorized by:   Tresa Garter MD   Signed by:   Tresa Garter MD on 03/08/2010   Method used:   Print then Give to Patient   RxID:   [id number]

## 2011-03-09 ENCOUNTER — Encounter: Payer: Self-pay | Admitting: Internal Medicine

## 2011-03-09 ENCOUNTER — Ambulatory Visit (INDEPENDENT_AMBULATORY_CARE_PROVIDER_SITE_OTHER): Payer: Managed Care, Other (non HMO) | Admitting: Internal Medicine

## 2011-03-09 DIAGNOSIS — H6120 Impacted cerumen, unspecified ear: Secondary | ICD-10-CM

## 2011-03-09 DIAGNOSIS — J019 Acute sinusitis, unspecified: Secondary | ICD-10-CM

## 2011-03-09 DIAGNOSIS — J329 Chronic sinusitis, unspecified: Secondary | ICD-10-CM | POA: Insufficient documentation

## 2011-03-09 DIAGNOSIS — G43909 Migraine, unspecified, not intractable, without status migrainosus: Secondary | ICD-10-CM

## 2011-03-09 MED ORDER — IBUPROFEN 600 MG PO TABS: ORAL_TABLET | ORAL | Status: AC

## 2011-03-09 MED ORDER — RIZATRIPTAN BENZOATE 10 MG PO TABS: 10.0000 mg | ORAL_TABLET | Freq: Once | ORAL | Status: DC | PRN

## 2011-03-09 MED ORDER — AMOXICILLIN 500 MG PO CAPS: 1000.0000 mg | ORAL_CAPSULE | Freq: Two times a day (BID) | ORAL | Status: AC

## 2011-03-09 NOTE — Assessment & Plan Note (Signed)
Amoxicillin x10d 

## 2011-03-09 NOTE — Progress Notes (Signed)
  Subjective:    Patient ID: George [name], male    DOB: July 26, 1966, 45 y.o.   MRN: [id number]  HPI  C/o HAs x 2 wks and congestion. No n/v C/o ear wax C/o migraines  Review of Systems  Constitutional: Negative for chills and fatigue.  HENT: Positive for rhinorrhea and postnasal drip. Negative for neck pain.   Eyes: Negative for discharge.  Respiratory: Negative for chest tightness.   Cardiovascular: Negative for chest pain.  Genitourinary: Negative for frequency.  Musculoskeletal: Negative for back pain.  Neurological: Positive for headaches.  Psychiatric/Behavioral: Negative for suicidal ideas.       Objective:   Physical Exam  Constitutional: He is oriented to person, place, and time. He appears well-developed.  HENT:  Mouth/Throat: Oropharynx is clear and moist.       Swollen nasal mucosa Wax B  Eyes: Conjunctivae are normal. Pupils are equal, round, and reactive to light.  Neck: Normal range of motion. No JVD present. No thyromegaly present.  Cardiovascular: Normal rate, regular rhythm, normal heart sounds and intact distal pulses.  Exam reveals no gallop and no friction rub.   No murmur heard. Pulmonary/Chest: Effort normal and breath sounds normal. No respiratory distress. He has no wheezes. He has no rales. He exhibits no tenderness.  Abdominal: Soft. Bowel sounds are normal. He exhibits no distension and no mass. There is no tenderness. There is no rebound and no guarding.  Musculoskeletal: Normal range of motion. He exhibits no edema and no tenderness.  Lymphadenopathy:    He has no cervical adenopathy.  Neurological: He is alert and oriented to person, place, and time. He has normal reflexes. No cranial nerve deficit. He exhibits normal muscle tone. Coordination normal.  Skin: Skin is warm and dry. No rash noted.  Psychiatric: He has a normal mood and affect. His behavior is normal. Judgment and thought content normal.          Assessment & Plan:  CERUMEN  IMPACTION I cleaned B ears manually  Sinusitis acute Amoxicillin x 10 d  Migraine headache Ibuprofen Maxalt prn    OCD - discussed

## 2011-03-09 NOTE — Assessment & Plan Note (Signed)
I cleaned B ears manually

## 2011-03-09 NOTE — Assessment & Plan Note (Signed)
Ibuprofen Maxalt prn
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
## 2011-07-23 ENCOUNTER — Telehealth: Payer: Self-pay | Admitting: *Deleted

## 2011-07-23 DIAGNOSIS — Z0000 Encounter for general adult medical examination without abnormal findings: Secondary | ICD-10-CM

## 2011-07-23 NOTE — Telephone Encounter (Signed)
[facility] 

## 2011-08-07 ENCOUNTER — Other Ambulatory Visit (INDEPENDENT_AMBULATORY_CARE_PROVIDER_SITE_OTHER): Payer: Managed Care, Other (non HMO)

## 2011-08-07 ENCOUNTER — Ambulatory Visit (INDEPENDENT_AMBULATORY_CARE_PROVIDER_SITE_OTHER): Payer: Managed Care, Other (non HMO) | Admitting: Internal Medicine

## 2011-08-07 ENCOUNTER — Encounter: Payer: Self-pay | Admitting: Internal Medicine

## 2011-08-07 VITALS — BP 100/60 | HR 80 | Temp 99.0°F | Resp 16 | Wt 139.0 lb

## 2011-08-07 DIAGNOSIS — R112 Nausea with vomiting, unspecified: Secondary | ICD-10-CM

## 2011-08-07 DIAGNOSIS — Z23 Encounter for immunization: Secondary | ICD-10-CM

## 2011-08-07 DIAGNOSIS — G43909 Migraine, unspecified, not intractable, without status migrainosus: Secondary | ICD-10-CM

## 2011-08-07 DIAGNOSIS — J019 Acute sinusitis, unspecified: Secondary | ICD-10-CM

## 2011-08-07 DIAGNOSIS — J3489 Other specified disorders of nose and nasal sinuses: Secondary | ICD-10-CM

## 2011-08-07 DIAGNOSIS — Z0000 Encounter for general adult medical examination without abnormal findings: Secondary | ICD-10-CM

## 2011-08-07 DIAGNOSIS — F411 Generalized anxiety disorder: Secondary | ICD-10-CM

## 2011-08-07 LAB — BASIC METABOLIC PANEL
BUN: 14 mg/dL (ref 6–23)
CO2: 29 mEq/L (ref 19–32)
Chloride: 104 mEq/L (ref 96–112)
Glucose, Bld: 83 mg/dL (ref 70–99)
Potassium: 3.9 mEq/L (ref 3.5–5.1)

## 2011-08-07 LAB — CBC WITH DIFFERENTIAL/PLATELET
Basophils Absolute: 0 10*3/uL (ref 0.0–0.1)
Basophils Relative: 0.7 % (ref 0.0–3.0)
Eosinophils Absolute: 0.2 10*3/uL (ref 0.0–0.7)
Lymphocytes Relative: 41.4 % (ref 12.0–46.0)
MCHC: 32.8 g/dL (ref 30.0–36.0)
Neutrophils Relative %: 42.6 % — ABNORMAL LOW (ref 43.0–77.0)
RBC: 4.1 Mil/uL — ABNORMAL LOW (ref 4.22–5.81)
RDW: 12.9 % (ref 11.5–14.6)

## 2011-08-07 LAB — HEPATIC FUNCTION PANEL
Alkaline Phosphatase: 62 U/L (ref 39–117)
Bilirubin, Direct: 0.2 mg/dL (ref 0.0–0.3)
Total Bilirubin: 1.6 mg/dL — ABNORMAL HIGH (ref 0.3–1.2)

## 2011-08-07 LAB — LIPID PANEL
Cholesterol: 153 mg/dL (ref 0–200)
LDL Cholesterol: 69 mg/dL (ref 0–99)
Triglycerides: 92 mg/dL (ref 0.0–149.0)
VLDL: 18.4 mg/dL (ref 0.0–40.0)

## 2011-08-07 LAB — URINALYSIS, ROUTINE W REFLEX MICROSCOPIC
Bilirubin Urine: NEGATIVE
Ketones, ur: NEGATIVE
Total Protein, Urine: NEGATIVE
pH: 6 (ref 5.0–8.0)

## 2011-08-07 MED ORDER — AMOXICILLIN-POT CLAVULANATE 875-125 MG PO TABS: 1.0000 | ORAL_TABLET | Freq: Two times a day (BID) | ORAL | Status: AC

## 2011-08-07 MED ORDER — FLUTICASONE PROPIONATE 50 MCG/ACT NA SUSP: 2.0000 | Freq: Every day | NASAL | Status: DC

## 2011-08-07 MED ORDER — ELETRIPTAN HYDROBROMIDE 40 MG PO TABS: 40.0000 mg | ORAL_TABLET | ORAL | Status: DC | PRN

## 2011-08-07 MED ORDER — METHYLPREDNISOLONE ACETATE 80 MG/ML IJ SUSP
120.0000 mg | Freq: Once | INTRAMUSCULAR | Status: AC
  Administered 2011-08-07: 120 mg via INTRAMUSCULAR

## 2011-08-07 NOTE — Progress Notes (Signed)
  Subjective:    Patient ID: George [name], male    DOB: 11/15/1965, 45 y.o.   MRN: [id number]  HPI  C/o nasal congestion, sinus d/c, yellow d/c, nausea x 2-3 mo Sinus meds did not help much  Review of Systems  Constitutional: Negative for appetite change, fatigue and unexpected weight change.  HENT: Positive for rhinorrhea and sinus pressure. Negative for nosebleeds, congestion, sore throat, sneezing, trouble swallowing and neck pain.   Eyes: Negative for itching and visual disturbance.  Respiratory: Negative for cough.   Cardiovascular: Negative for chest pain, palpitations and leg swelling.  Gastrointestinal: Negative for nausea, diarrhea, blood in stool and abdominal distention.  Genitourinary: Negative for frequency and hematuria.  Musculoskeletal: Negative for back pain, joint swelling and gait problem.  Skin: Negative for rash.  Neurological: Negative for dizziness, tremors, speech difficulty and weakness.  Psychiatric/Behavioral: Negative for sleep disturbance, dysphoric mood and agitation. The patient is nervous/anxious.        Objective:   Physical Exam  Constitutional: He is oriented to person, place, and time. He appears well-developed.  HENT:  Mouth/Throat: Oropharynx is clear and moist.  Eyes: Conjunctivae are normal. Pupils are equal, round, and reactive to light.  Neck: Normal range of motion. No JVD present. No thyromegaly present.  Cardiovascular: Normal rate, regular rhythm, normal heart sounds and intact distal pulses.  Exam reveals no gallop and no friction rub.   No murmur heard. Pulmonary/Chest: Effort normal and breath sounds normal. No respiratory distress. He has no wheezes. He has no rales. He exhibits no tenderness.  Abdominal: Soft. Bowel sounds are normal. He exhibits no distension and no mass. There is no tenderness. There is no rebound and no guarding.  Musculoskeletal: Normal range of motion. He exhibits no edema and no tenderness.    Lymphadenopathy:    He has no cervical adenopathy.  Neurological: He is alert and oriented to person, place, and time. He has normal reflexes. No cranial nerve deficit. He exhibits normal muscle tone. Coordination normal.  Skin: Skin is warm and dry. No rash noted.  Psychiatric: His behavior is normal. Judgment and thought content normal.       anxious    Procedure Note :    Procedure :   Point of care (POC) sonography examination   Indication: sinus congestion   Equipment used: Sonosite M-Turbo with HFL38x/13-6 MHz transducer linear probe. The images were stored in the unit and later transferred in storage.  The patient was placed in a sitting position.  This study revealed no fluid in B max. sinuses   Impression: B max sinuses are not filled w/fluid         Assessment & Plan:

## 2011-08-07 NOTE — Assessment & Plan Note (Addendum)
Depo 120 mg Will try Augmentin

## 2011-08-07 NOTE — Assessment & Plan Note (Signed)
Flonase

## 2011-08-07 NOTE — Assessment & Plan Note (Signed)
Relpax prn 

## 2011-08-22 ENCOUNTER — Ambulatory Visit (INDEPENDENT_AMBULATORY_CARE_PROVIDER_SITE_OTHER): Payer: Managed Care, Other (non HMO) | Admitting: Internal Medicine

## 2011-08-22 ENCOUNTER — Encounter: Payer: Self-pay | Admitting: Internal Medicine

## 2011-08-22 VITALS — BP 100/70 | HR 72 | Temp 98.9°F | Resp 16 | Wt 140.0 lb

## 2011-08-22 DIAGNOSIS — Z0000 Encounter for general adult medical examination without abnormal findings: Secondary | ICD-10-CM

## 2011-08-22 DIAGNOSIS — Z136 Encounter for screening for cardiovascular disorders: Secondary | ICD-10-CM

## 2011-08-22 DIAGNOSIS — R7989 Other specified abnormal findings of blood chemistry: Secondary | ICD-10-CM

## 2011-08-22 NOTE — Assessment & Plan Note (Signed)
We discussed age appropriate health related issues, including available/recomended screening tests and vaccinations. We discussed a need for adhering to healthy diet and exercise. Labs/EKG were reviewed/ordered. All questions were answered.   

## 2011-08-22 NOTE — Assessment & Plan Note (Signed)
Repeat CBC in 6 mo

## 2011-08-22 NOTE — Progress Notes (Signed)
  Subjective:    Patient ID: George [name], male    DOB: 07/12/1966, 45 y.o.   MRN: [id number]  HPI  The patient is here for a wellness exam. The patient has been doing well overall without major physical or psychological issues going on lately. Sinuses are better ( he did not finish abx)  Review of Systems  Constitutional: Negative for appetite change, fatigue and unexpected weight change.  HENT: Negative for nosebleeds, congestion, sore throat, sneezing, trouble swallowing and neck pain.   Eyes: Negative for itching and visual disturbance.  Respiratory: Negative for cough.   Cardiovascular: Negative for chest pain, palpitations and leg swelling.  Gastrointestinal: Negative for nausea, diarrhea, blood in stool and abdominal distention.  Genitourinary: Negative for frequency and hematuria.  Musculoskeletal: Negative for back pain, joint swelling and gait problem.  Skin: Negative for rash.  Neurological: Negative for dizziness, tremors, speech difficulty and weakness.  Psychiatric/Behavioral: Negative for sleep disturbance, dysphoric mood and agitation. The patient is not nervous/anxious.        Objective:   Physical Exam  Constitutional: He is oriented to person, place, and time. He appears well-developed.  HENT:  Mouth/Throat: Oropharynx is clear and moist.  Eyes: Conjunctivae are normal. Pupils are equal, round, and reactive to light.  Neck: Normal range of motion. No JVD present. No thyromegaly present.  Cardiovascular: Normal rate, regular rhythm, normal heart sounds and intact distal pulses.  Exam reveals no gallop and no friction rub.   No murmur heard. Pulmonary/Chest: Effort normal and breath sounds normal. No respiratory distress. He has no wheezes. He has no rales. He exhibits no tenderness.  Abdominal: Soft. Bowel sounds are normal. He exhibits no distension and no mass. There is no tenderness. There is no rebound and no guarding.  Genitourinary: Penis normal.   Testicles are ok  Musculoskeletal: Normal range of motion. He exhibits no edema and no tenderness.  Lymphadenopathy:    He has no cervical adenopathy.  Neurological: He is alert and oriented to person, place, and time. He has normal reflexes. No cranial nerve deficit. He exhibits normal muscle tone. Coordination normal.  Skin: Skin is warm and dry. No rash noted.  Psychiatric: His behavior is normal. Judgment and thought content normal.       anxious    Wt Readings from Last 3 Encounters:  08/22/11 140 lb (63.504 kg)  08/07/11 139 lb (63.05 kg)  03/09/11 135 lb (61.236 kg)   Lab Results  Component Value Date   WBC 3.7* 08/07/2011   HGB 12.7* 08/07/2011   HCT 38.8* 08/07/2011   PLT 209.0 08/07/2011   GLUCOSE 83 08/07/2011   CHOL 153 08/07/2011   TRIG 92.0 08/07/2011   HDL 65.90 08/07/2011   LDLCALC 69 08/07/2011   ALT 13 08/07/2011   AST 16 08/07/2011   NA 139 08/07/2011   K 3.9 08/07/2011   CL 104 08/07/2011   CREATININE 0.9 08/07/2011   BUN 14 08/07/2011   CO2 29 08/07/2011   TSH 0.89 08/07/2011   PSA 0.48 07/10/2010    EKG - no change     Assessment & Plan:

## 2012-02-20 ENCOUNTER — Encounter: Payer: Self-pay | Admitting: Internal Medicine

## 2012-02-20 ENCOUNTER — Ambulatory Visit (INDEPENDENT_AMBULATORY_CARE_PROVIDER_SITE_OTHER): Payer: Managed Care, Other (non HMO) | Admitting: Internal Medicine

## 2012-02-20 ENCOUNTER — Other Ambulatory Visit (INDEPENDENT_AMBULATORY_CARE_PROVIDER_SITE_OTHER): Payer: Managed Care, Other (non HMO)

## 2012-02-20 DIAGNOSIS — F3289 Other specified depressive episodes: Secondary | ICD-10-CM

## 2012-02-20 DIAGNOSIS — G43909 Migraine, unspecified, not intractable, without status migrainosus: Secondary | ICD-10-CM

## 2012-02-20 DIAGNOSIS — F329 Major depressive disorder, single episode, unspecified: Secondary | ICD-10-CM

## 2012-02-20 DIAGNOSIS — R7989 Other specified abnormal findings of blood chemistry: Secondary | ICD-10-CM

## 2012-02-20 DIAGNOSIS — F411 Generalized anxiety disorder: Secondary | ICD-10-CM

## 2012-02-20 LAB — CBC WITH DIFFERENTIAL/PLATELET
Basophils Absolute: 0 10*3/uL (ref 0.0–0.1)
Basophils Relative: 0.6 % (ref 0.0–3.0)
Eosinophils Absolute: 0.4 10*3/uL (ref 0.0–0.7)
Hemoglobin: 13.1 g/dL (ref 13.0–17.0)
MCHC: 33.5 g/dL (ref 30.0–36.0)
MCV: 93 fl (ref 78.0–100.0)
Monocytes Absolute: 0.5 10*3/uL (ref 0.1–1.0)
Neutro Abs: 2.3 10*3/uL (ref 1.4–7.7)
Neutrophils Relative %: 47.8 % (ref 43.0–77.0)
RBC: 4.19 Mil/uL — ABNORMAL LOW (ref 4.22–5.81)
RDW: 13.2 % (ref 11.5–14.6)

## 2012-02-20 LAB — BASIC METABOLIC PANEL
CO2: 27 mEq/L (ref 19–32)
GFR: 110.17 mL/min (ref >60.00)
Glucose, Bld: 87 mg/dL (ref 70–99)
Potassium: 4.1 mEq/L (ref 3.5–5.1)
Sodium: 140 mEq/L (ref 135–145)

## 2012-02-20 NOTE — Assessment & Plan Note (Signed)
Chronic w/OCD traits - better

## 2012-02-20 NOTE — Assessment & Plan Note (Signed)
Relpax prn 

## 2012-02-20 NOTE — Progress Notes (Signed)
Patient ID: George [name], male   DOB: 1966/05/20, 46 y.o.   MRN: [id number]  Subjective:    Patient ID: George [name], male    DOB: 1966-02-20, 46 y.o.   MRN: [id number]  HPI  F/u migraine HAs, OCD, rhinitis. The patient has been doing well overall without major physical or psychological issues going on lately. Sinuses are better ( he did not finish abx)  BP Readings from Last 3 Encounters:  02/20/12 100/70  08/22/11 100/70  08/07/11 100/60    Review of Systems  Constitutional: Negative for appetite change, fatigue and unexpected weight change.  HENT: Negative for nosebleeds, congestion, sore throat, sneezing, trouble swallowing and neck pain.   Eyes: Negative for itching and visual disturbance.  Respiratory: Negative for cough.   Cardiovascular: Negative for chest pain, palpitations and leg swelling.  Gastrointestinal: Negative for nausea, diarrhea, blood in stool and abdominal distention.  Genitourinary: Negative for frequency and hematuria.  Musculoskeletal: Negative for back pain, joint swelling and gait problem.  Skin: Negative for rash.  Neurological: Negative for dizziness, tremors, speech difficulty and weakness.  Psychiatric/Behavioral: Negative for sleep disturbance, dysphoric mood and agitation. The patient is not nervous/anxious.        Objective:   Physical Exam  Constitutional: He is oriented to person, place, and time. He appears well-developed.  HENT:  Mouth/Throat: Oropharynx is clear and moist.  Eyes: Conjunctivae are normal. Pupils are equal, round, and reactive to light.  Neck: Normal range of motion. No JVD present. No thyromegaly present.  Cardiovascular: Normal rate, regular rhythm, normal heart sounds and intact distal pulses.  Exam reveals no gallop and no friction rub.   No murmur heard. Pulmonary/Chest: Effort normal and breath sounds normal. No respiratory distress. He has no wheezes. He has no rales. He exhibits no tenderness.  Abdominal: Soft.  Bowel sounds are normal. He exhibits no distension and no mass. There is no tenderness. There is no rebound and no guarding.  Genitourinary: Penis normal.       Testicles are ok  Musculoskeletal: Normal range of motion. He exhibits no edema and no tenderness.  Lymphadenopathy:    He has no cervical adenopathy.  Neurological: He is alert and oriented to person, place, and time. He has normal reflexes. No cranial nerve deficit. He exhibits normal muscle tone. Coordination normal.  Skin: Skin is warm and dry. No rash noted.  Psychiatric: His behavior is normal. Judgment and thought content normal.       anxious    Wt Readings from Last 3 Encounters:  02/20/12 139 lb (63.05 kg)  08/22/11 140 lb (63.504 kg)  08/07/11 139 lb (63.05 kg)   Lab Results  Component Value Date   WBC 3.7* 08/07/2011   HGB 12.7* 08/07/2011   HCT 38.8* 08/07/2011   PLT 209.0 08/07/2011   GLUCOSE 83 08/07/2011   CHOL 153 08/07/2011   TRIG 92.0 08/07/2011   HDL 65.90 08/07/2011   LDLCALC 69 08/07/2011   ALT 13 08/07/2011   AST 16 08/07/2011   NA 139 08/07/2011   K 3.9 08/07/2011   CL 104 08/07/2011   CREATININE 0.9 08/07/2011   BUN 14 08/07/2011   CO2 29 08/07/2011   TSH 0.89 08/07/2011   PSA 0.48 07/10/2010         Assessment & Plan:

## 2012-02-20 NOTE — Assessment & Plan Note (Signed)
Check labs 

## 2012-02-20 NOTE — Assessment & Plan Note (Signed)
Chronic w/OCD traits - doing well

## 2012-02-21 ENCOUNTER — Telehealth: Payer: Self-pay | Admitting: *Deleted

## 2012-02-21 DIAGNOSIS — Z0000 Encounter for general adult medical examination without abnormal findings: Secondary | ICD-10-CM

## 2012-02-21 DIAGNOSIS — Z125 Encounter for screening for malignant neoplasm of prostate: Secondary | ICD-10-CM

## 2012-02-21 NOTE — Telephone Encounter (Signed)
Received staff msg pt made cpx need labs entered in epic... 02/21/12@11 :30am/LMB

## 2012-06-09 ENCOUNTER — Ambulatory Visit (INDEPENDENT_AMBULATORY_CARE_PROVIDER_SITE_OTHER): Payer: Managed Care, Other (non HMO) | Admitting: Internal Medicine

## 2012-06-09 ENCOUNTER — Other Ambulatory Visit (INDEPENDENT_AMBULATORY_CARE_PROVIDER_SITE_OTHER): Payer: Managed Care, Other (non HMO)

## 2012-06-09 ENCOUNTER — Encounter: Payer: Self-pay | Admitting: Internal Medicine

## 2012-06-09 VITALS — BP 102/70 | HR 72 | Temp 97.8°F | Resp 16 | Wt 145.0 lb

## 2012-06-09 DIAGNOSIS — M545 Low back pain, unspecified: Secondary | ICD-10-CM

## 2012-06-09 DIAGNOSIS — G43909 Migraine, unspecified, not intractable, without status migrainosus: Secondary | ICD-10-CM

## 2012-06-09 DIAGNOSIS — N529 Male erectile dysfunction, unspecified: Secondary | ICD-10-CM

## 2012-06-09 DIAGNOSIS — B009 Herpesviral infection, unspecified: Secondary | ICD-10-CM

## 2012-06-09 DIAGNOSIS — B001 Herpesviral vesicular dermatitis: Secondary | ICD-10-CM | POA: Insufficient documentation

## 2012-06-09 LAB — URINALYSIS
Bilirubin Urine: NEGATIVE
Hgb urine dipstick: NEGATIVE
Ketones, ur: NEGATIVE
Urine Glucose: NEGATIVE
Urobilinogen, UA: 4 (ref 0.0–1.0)

## 2012-06-09 MED ORDER — ELETRIPTAN HYDROBROMIDE 40 MG PO TABS: 40.0000 mg | ORAL_TABLET | ORAL | Status: DC | PRN

## 2012-06-09 MED ORDER — PENCICLOVIR 1 % EX CREA: TOPICAL_CREAM | CUTANEOUS | Status: AC

## 2012-06-09 MED ORDER — SILDENAFIL CITRATE 50 MG PO TABS: 25.0000 mg | ORAL_TABLET | Freq: Every day | ORAL | Status: DC | PRN

## 2012-06-09 NOTE — Assessment & Plan Note (Signed)
Continue with current prescription therapy as reflected on the Med list.  

## 2012-06-09 NOTE — Assessment & Plan Note (Signed)
denavir topical

## 2012-06-09 NOTE — Assessment & Plan Note (Signed)
Urol cons - he'll make an appt himself

## 2012-06-09 NOTE — Progress Notes (Signed)
   Subjective:    Patient ID: George [name], male    DOB: 05/29/1966, 46 y.o.   MRN: [id number]  HPI  F/u migraine HAs, OCD, rhinitis. The patient has been doing well overall without major physical or psychological issues going on lately  C/o R LBP C/o a cold sore on L lowe lip C/o ED - he will see a urologist  BP Readings from Last 3 Encounters:  06/09/12 102/70  02/20/12 100/70  08/22/11 100/70   Wt Readings from Last 3 Encounters:  06/09/12 145 lb (65.772 kg)  02/20/12 139 lb (63.05 kg)  08/22/11 140 lb (63.504 kg)     Review of Systems  Constitutional: Negative for appetite change, fatigue and unexpected weight change.  HENT: Negative for nosebleeds, congestion, sore throat, sneezing, trouble swallowing and neck pain.   Eyes: Negative for itching and visual disturbance.  Respiratory: Negative for cough.   Cardiovascular: Negative for chest pain, palpitations and leg swelling.  Gastrointestinal: Negative for nausea, diarrhea, blood in stool and abdominal distention.  Genitourinary: Negative for frequency and hematuria.  Musculoskeletal: Negative for back pain, joint swelling and gait problem.  Skin: Negative for rash.  Neurological: Negative for dizziness, tremors, speech difficulty and weakness.  Psychiatric/Behavioral: Negative for disturbed wake/sleep cycle, dysphoric mood and agitation. The patient is not nervous/anxious.        Objective:   Physical Exam  Constitutional: He is oriented to person, place, and time. He appears well-developed.  HENT:  Mouth/Throat: Oropharynx is clear and moist.  Eyes: Conjunctivae are normal. Pupils are equal, round, and reactive to light.  Neck: Normal range of motion. No JVD present. No thyromegaly present.  Cardiovascular: Normal rate, regular rhythm, normal heart sounds and intact distal pulses.  Exam reveals no gallop and no friction rub.   No murmur heard. Pulmonary/Chest: Effort normal and breath sounds normal. No  respiratory distress. He has no wheezes. He has no rales. He exhibits no tenderness.  Abdominal: Soft. Bowel sounds are normal. He exhibits no distension and no mass. There is no tenderness. There is no rebound and no guarding.  Genitourinary: Penis normal.       Testicles are ok  Musculoskeletal: Normal range of motion. He exhibits no edema and no tenderness.  Lymphadenopathy:    He has no cervical adenopathy.  Neurological: He is alert and oriented to person, place, and time. He has normal reflexes. No cranial nerve deficit. He exhibits normal muscle tone. Coordination normal.  Skin: Skin is warm and dry. No rash noted.  Psychiatric: His behavior is normal. Judgment and thought content normal.       anxious  cold sore L lower lip  Wt Readings from Last 3 Encounters:  06/09/12 145 lb (65.772 kg)  02/20/12 139 lb (63.05 kg)  08/22/11 140 lb (63.504 kg)   Lab Results  Component Value Date   WBC 4.9 02/20/2012   HGB 13.1 02/20/2012   HCT 38.9* 02/20/2012   PLT 241.0 02/20/2012   GLUCOSE 87 02/20/2012   CHOL 153 08/07/2011   TRIG 92.0 08/07/2011   HDL 65.90 08/07/2011   LDLCALC 69 08/07/2011   ALT 13 08/07/2011   AST 16 08/07/2011   NA 140 02/20/2012   K 4.1 02/20/2012   CL 104 02/20/2012   CREATININE 0.9 02/20/2012   BUN 17 02/20/2012   CO2 27 02/20/2012   TSH 0.89 08/07/2011   PSA 0.48 07/10/2010         Assessment & Plan:

## 2012-08-29 ENCOUNTER — Other Ambulatory Visit (INDEPENDENT_AMBULATORY_CARE_PROVIDER_SITE_OTHER): Payer: Managed Care, Other (non HMO)

## 2012-08-29 DIAGNOSIS — R7989 Other specified abnormal findings of blood chemistry: Secondary | ICD-10-CM

## 2012-08-29 LAB — BASIC METABOLIC PANEL
BUN: 12 mg/dL (ref 6–23)
Creatinine, Ser: 0.7 mg/dL (ref 0.4–1.5)
GFR: 162.49 mL/min (ref >60.00)
Potassium: 3.5 mEq/L (ref 3.5–5.1)

## 2012-08-29 LAB — CBC WITH DIFFERENTIAL/PLATELET
Basophils Relative: 0.9 % (ref 0.0–3.0)
Eosinophils Absolute: 0.2 10*3/uL (ref 0.0–0.7)
MCHC: 32.6 g/dL (ref 30.0–36.0)
MCV: 94.4 fl (ref 78.0–100.0)
Monocytes Absolute: 0.3 10*3/uL (ref 0.1–1.0)
Neutrophils Relative %: 37.2 % — ABNORMAL LOW (ref 43.0–77.0)
RBC: 4.2 Mil/uL — ABNORMAL LOW (ref 4.22–5.81)
RDW: 12.5 % (ref 11.5–14.6)

## 2012-09-01 ENCOUNTER — Ambulatory Visit (INDEPENDENT_AMBULATORY_CARE_PROVIDER_SITE_OTHER): Payer: Managed Care, Other (non HMO) | Admitting: Internal Medicine

## 2012-09-01 ENCOUNTER — Encounter: Payer: Self-pay | Admitting: Internal Medicine

## 2012-09-01 VITALS — BP 100/70 | HR 72 | Temp 97.7°F | Resp 16 | Ht 65.0 in | Wt 142.0 lb

## 2012-09-01 DIAGNOSIS — R7989 Other specified abnormal findings of blood chemistry: Secondary | ICD-10-CM

## 2012-09-01 DIAGNOSIS — G43909 Migraine, unspecified, not intractable, without status migrainosus: Secondary | ICD-10-CM

## 2012-09-01 DIAGNOSIS — Z0000 Encounter for general adult medical examination without abnormal findings: Secondary | ICD-10-CM

## 2012-09-01 DIAGNOSIS — F329 Major depressive disorder, single episode, unspecified: Secondary | ICD-10-CM

## 2012-09-01 DIAGNOSIS — F3289 Other specified depressive episodes: Secondary | ICD-10-CM

## 2012-09-01 DIAGNOSIS — J309 Allergic rhinitis, unspecified: Secondary | ICD-10-CM

## 2012-09-01 DIAGNOSIS — N529 Male erectile dysfunction, unspecified: Secondary | ICD-10-CM

## 2012-09-01 MED ORDER — METHYLPREDNISOLONE ACETATE 80 MG/ML IJ SUSP
120.0000 mg | Freq: Once | INTRAMUSCULAR | Status: AC
  Administered 2012-09-01: 120 mg via INTRAMUSCULAR

## 2012-09-01 MED ORDER — PAROXETINE HCL 10 MG PO TABS: 10.0000 mg | ORAL_TABLET | ORAL | Status: DC

## 2012-09-01 NOTE — Assessment & Plan Note (Signed)
depomedrol 120 mg im

## 2012-09-01 NOTE — Assessment & Plan Note (Signed)
Start Paxil - low dose

## 2012-09-01 NOTE — Progress Notes (Signed)
   Subjective:    Patient ID: George [name], male    DOB: [birth year], 46 y.o.   MRN: [id number]  HPI  The patient is here for a wellness exam. The patient has been doing well overall without major physical or psychological issues going on lately, except for anxiety  He is re-married  C/o allergies, migraines  BP Readings from Last 3 Encounters:  09/01/12 100/70  06/09/12 102/70  02/20/12 100/70     Review of Systems  Constitutional: Negative for appetite change, fatigue and unexpected weight change.  HENT: Negative for nosebleeds, congestion, sore throat, sneezing, trouble swallowing and neck pain.   Eyes: Negative for itching and visual disturbance.  Respiratory: Negative for cough.   Cardiovascular: Negative for chest pain, palpitations and leg swelling.  Gastrointestinal: Negative for nausea, diarrhea, blood in stool and abdominal distention.  Genitourinary: Negative for frequency and hematuria.  Musculoskeletal: Negative for back pain, joint swelling and gait problem.  Skin: Negative for rash.  Neurological: Negative for dizziness, tremors, speech difficulty and weakness.  Psychiatric/Behavioral: Negative for disturbed wake/sleep cycle, dysphoric mood and agitation. The patient is not nervous/anxious.        Objective:   Physical Exam  Constitutional: He is oriented to person, place, and time. He appears well-developed.  HENT:  Mouth/Throat: Oropharynx is clear and moist.  Eyes: Conjunctivae normal are normal. Pupils are equal, round, and reactive to light.  Neck: Normal range of motion. No JVD present. No thyromegaly present.  Cardiovascular: Normal rate, regular rhythm, normal heart sounds and intact distal pulses.  Exam reveals no gallop and no friction rub.   No murmur heard. Pulmonary/Chest: Effort normal and breath sounds normal. No respiratory distress. He has no wheezes. He has no rales. He exhibits no tenderness.  Abdominal: Soft. Bowel sounds are normal. He  exhibits no distension and no mass. There is no tenderness. There is no rebound and no guarding.  Genitourinary: Penis normal.       Testicles are ok  Musculoskeletal: Normal range of motion. He exhibits no edema and no tenderness.  Lymphadenopathy:    He has no cervical adenopathy.  Neurological: He is alert and oriented to person, place, and time. He has normal reflexes. No cranial nerve deficit. He exhibits normal muscle tone. Coordination normal.  Skin: Skin is warm and dry. No rash noted.  Psychiatric: His behavior is normal. Judgment and thought content normal.       anxious    Wt Readings from Last 3 Encounters:  09/01/12 142 lb (64.411 kg)  06/09/12 145 lb (65.772 kg)  02/20/12 139 lb (63.05 kg)   Lab Results  Component Value Date   WBC 3.8* 08/29/2012   HGB 12.9* 08/29/2012   HCT 39.6 08/29/2012   PLT 218.0 08/29/2012   GLUCOSE 80 08/29/2012   CHOL 153 08/07/2011   TRIG 92.0 08/07/2011   HDL 65.90 08/07/2011   LDLCALC 69 08/07/2011   ALT 13 08/07/2011   AST 16 08/07/2011   NA 137 08/29/2012   K 3.5 08/29/2012   CL 103 08/29/2012   CREATININE 0.7 08/29/2012   BUN 12 08/29/2012   CO2 28 08/29/2012   TSH 0.89 08/07/2011   PSA 0.48 07/10/2010         Assessment & Plan:

## 2012-09-02 NOTE — Assessment & Plan Note (Signed)
Monitoring CBC q 6 mo

## 2012-09-02 NOTE — Assessment & Plan Note (Signed)
See meds prn

## 2012-09-02 NOTE — Assessment & Plan Note (Signed)
We discussed age appropriate health related issues, including available/recomended screening tests and vaccinations. We discussed a need for adhering to healthy diet and exercise. Labs/EKG were reviewed/ordered. All questions were answered.   

## 2012-09-02 NOTE — Assessment & Plan Note (Signed)
Mild He has premature ejaculation problem as well Start Paxil Staxin prn

## 2012-09-02 NOTE — Addendum Note (Signed)
Addended by: Tresa Garter on: 09/02/2012 01:22 PM   Modules accepted: Level of Service

## 2013-02-20 ENCOUNTER — Other Ambulatory Visit (INDEPENDENT_AMBULATORY_CARE_PROVIDER_SITE_OTHER): Payer: Managed Care, Other (non HMO)

## 2013-02-20 DIAGNOSIS — Z0000 Encounter for general adult medical examination without abnormal findings: Secondary | ICD-10-CM

## 2013-02-20 DIAGNOSIS — Z125 Encounter for screening for malignant neoplasm of prostate: Secondary | ICD-10-CM

## 2013-02-20 LAB — LIPID PANEL: HDL: 66.9 mg/dL (ref >39.00)

## 2013-02-20 LAB — BASIC METABOLIC PANEL
BUN: 15 mg/dL (ref 6–23)
Calcium: 8.9 mg/dL (ref 8.4–10.5)
Creatinine, Ser: 0.9 mg/dL (ref 0.4–1.5)
GFR: 121.57 mL/min (ref >60.00)
Glucose, Bld: 97 mg/dL (ref 70–99)

## 2013-02-20 LAB — CBC WITH DIFFERENTIAL/PLATELET
Basophils Relative: 0.8 % (ref 0.0–3.0)
Eosinophils Relative: 4.2 % (ref 0.0–5.0)
HCT: 37.7 % — ABNORMAL LOW (ref 39.0–52.0)
Lymphs Abs: 1.4 10*3/uL (ref 0.7–4.0)
MCV: 92.5 fl (ref 78.0–100.0)
Monocytes Absolute: 0.4 10*3/uL (ref 0.1–1.0)
Neutro Abs: 1.7 10*3/uL (ref 1.4–7.7)
Platelets: 217 10*3/uL (ref 150.0–400.0)
WBC: 3.7 10*3/uL — ABNORMAL LOW (ref 4.5–10.5)

## 2013-02-20 LAB — URINALYSIS, ROUTINE W REFLEX MICROSCOPIC
Hgb urine dipstick: NEGATIVE
Leukocytes, UA: NEGATIVE
Specific Gravity, Urine: 1.025 (ref 1.000–1.030)
Urobilinogen, UA: 4 (ref 0.0–1.0)

## 2013-02-20 LAB — TSH: TSH: 1.34 u[IU]/mL (ref 0.35–5.50)

## 2013-02-20 LAB — HEPATIC FUNCTION PANEL: Albumin: 4 g/dL (ref 3.5–5.2)

## 2013-03-02 ENCOUNTER — Ambulatory Visit (INDEPENDENT_AMBULATORY_CARE_PROVIDER_SITE_OTHER): Payer: Managed Care, Other (non HMO) | Admitting: Internal Medicine

## 2013-03-02 ENCOUNTER — Encounter: Payer: Self-pay | Admitting: Internal Medicine

## 2013-03-02 VITALS — BP 118/86 | HR 80 | Temp 98.4°F | Resp 16 | Wt 144.0 lb

## 2013-03-02 DIAGNOSIS — R197 Diarrhea, unspecified: Secondary | ICD-10-CM

## 2013-03-02 DIAGNOSIS — N529 Male erectile dysfunction, unspecified: Secondary | ICD-10-CM

## 2013-03-02 DIAGNOSIS — F3289 Other specified depressive episodes: Secondary | ICD-10-CM

## 2013-03-02 DIAGNOSIS — E739 Lactose intolerance, unspecified: Secondary | ICD-10-CM

## 2013-03-02 DIAGNOSIS — E876 Hypokalemia: Secondary | ICD-10-CM

## 2013-03-02 DIAGNOSIS — R7989 Other specified abnormal findings of blood chemistry: Secondary | ICD-10-CM

## 2013-03-02 DIAGNOSIS — K589 Irritable bowel syndrome without diarrhea: Secondary | ICD-10-CM

## 2013-03-02 DIAGNOSIS — F329 Major depressive disorder, single episode, unspecified: Secondary | ICD-10-CM

## 2013-03-02 DIAGNOSIS — F411 Generalized anxiety disorder: Secondary | ICD-10-CM

## 2013-03-02 MED ORDER — POTASSIUM CHLORIDE ER 10 MEQ PO TBCR: 10.0000 meq | EXTENDED_RELEASE_TABLET | Freq: Two times a day (BID) | ORAL | Status: DC

## 2013-03-02 NOTE — Assessment & Plan Note (Signed)
Chronic. 

## 2013-03-02 NOTE — Assessment & Plan Note (Signed)
Milk avoidance

## 2013-03-02 NOTE — Progress Notes (Signed)
   Subjective:    HPI   F/u anxiety, ED He is re-married - stress in the marriage F/u allergies, migraines  BP Readings from Last 3 Encounters:  03/02/13 118/86  09/01/12 100/70  06/09/12 102/70   Wt Readings from Last 3 Encounters:  03/02/13 144 lb (65.318 kg)  09/01/12 142 lb (64.411 kg)  06/09/12 145 lb (65.772 kg)     Review of Systems  Constitutional: Negative for appetite change, fatigue and unexpected weight change.  HENT: Negative for nosebleeds, congestion, sore throat, sneezing, trouble swallowing and neck pain.   Eyes: Negative for itching and visual disturbance.  Respiratory: Negative for cough.   Cardiovascular: Negative for chest pain, palpitations and leg swelling.  Gastrointestinal: Negative for nausea, diarrhea, blood in stool and abdominal distention.  Genitourinary: Negative for frequency and hematuria.  Musculoskeletal: Negative for back pain, joint swelling and gait problem.  Skin: Negative for rash.  Neurological: Negative for dizziness, tremors, speech difficulty and weakness.  Psychiatric/Behavioral: Negative for sleep disturbance, dysphoric mood and agitation. The patient is not nervous/anxious.        Objective:   Physical Exam  Constitutional: He is oriented to person, place, and time. He appears well-developed.  HENT:  Mouth/Throat: Oropharynx is clear and moist.  Eyes: Conjunctivae are normal. Pupils are equal, round, and reactive to light.  Neck: Normal range of motion. No JVD present. No thyromegaly present.  Cardiovascular: Normal rate, regular rhythm, normal heart sounds and intact distal pulses.  Exam reveals no gallop and no friction rub.   No murmur heard. Pulmonary/Chest: Effort normal and breath sounds normal. No respiratory distress. He has no wheezes. He has no rales. He exhibits no tenderness.  Abdominal: Soft. Bowel sounds are normal. He exhibits no distension and no mass. There is no tenderness. There is no rebound and no  guarding.  Genitourinary: Penis normal.  Testicles are ok  Musculoskeletal: Normal range of motion. He exhibits no edema and no tenderness.  Lymphadenopathy:    He has no cervical adenopathy.  Neurological: He is alert and oriented to person, place, and time. He has normal reflexes. No cranial nerve deficit. He exhibits normal muscle tone. Coordination normal.  Skin: Skin is warm and dry. No rash noted.  Psychiatric: His behavior is normal. Judgment and thought content normal.  anxious     Lab Results  Component Value Date   WBC 3.7* 02/20/2013   HGB 12.7* 02/20/2013   HCT 37.7* 02/20/2013   PLT 217.0 02/20/2013   GLUCOSE 97 02/20/2013   CHOL 167 02/20/2013   TRIG 112.0 02/20/2013   HDL 66.90 02/20/2013   LDLCALC 78 02/20/2013   ALT 21 02/20/2013   AST 22 02/20/2013   NA 134* 02/20/2013   K 3.4* 02/20/2013   CL 102 02/20/2013   CREATININE 0.9 02/20/2013   BUN 15 02/20/2013   CO2 27 02/20/2013   TSH 1.34 02/20/2013   PSA 0.46 02/20/2013         Assessment & Plan:

## 2013-03-02 NOTE — Assessment & Plan Note (Signed)
D/c caffeine Rx KCl

## 2013-03-02 NOTE — Assessment & Plan Note (Signed)
Continue with current prescription therapy as reflected on the Med list.  

## 2013-03-02 NOTE — Assessment & Plan Note (Signed)
KCl qd x 1 mo

## 2013-03-02 NOTE — Assessment & Plan Note (Signed)
IBS-D  Cut back on caffeine

## 2013-08-21 ENCOUNTER — Other Ambulatory Visit (INDEPENDENT_AMBULATORY_CARE_PROVIDER_SITE_OTHER): Payer: Managed Care, Other (non HMO)

## 2013-08-21 DIAGNOSIS — K589 Irritable bowel syndrome without diarrhea: Secondary | ICD-10-CM

## 2013-08-21 DIAGNOSIS — F329 Major depressive disorder, single episode, unspecified: Secondary | ICD-10-CM

## 2013-08-21 DIAGNOSIS — E876 Hypokalemia: Secondary | ICD-10-CM

## 2013-08-21 DIAGNOSIS — F3289 Other specified depressive episodes: Secondary | ICD-10-CM

## 2013-08-21 DIAGNOSIS — E739 Lactose intolerance, unspecified: Secondary | ICD-10-CM

## 2013-08-21 DIAGNOSIS — F411 Generalized anxiety disorder: Secondary | ICD-10-CM

## 2013-08-21 DIAGNOSIS — R7989 Other specified abnormal findings of blood chemistry: Secondary | ICD-10-CM

## 2013-08-21 DIAGNOSIS — N529 Male erectile dysfunction, unspecified: Secondary | ICD-10-CM

## 2013-08-21 DIAGNOSIS — R197 Diarrhea, unspecified: Secondary | ICD-10-CM

## 2013-08-21 LAB — CBC WITH DIFFERENTIAL/PLATELET
Basophils Absolute: 0 10*3/uL (ref 0.0–0.1)
Eosinophils Relative: 5.5 % — ABNORMAL HIGH (ref 0.0–5.0)
HCT: 37 % — ABNORMAL LOW (ref 39.0–52.0)
Lymphs Abs: 1.4 10*3/uL (ref 0.7–4.0)
MCV: 91.3 fl (ref 78.0–100.0)
Monocytes Absolute: 0.4 10*3/uL (ref 0.1–1.0)
Platelets: 255 10*3/uL (ref 150.0–400.0)
RDW: 12.7 % (ref 11.5–14.6)

## 2013-08-21 LAB — VITAMIN B12: Vitamin B-12: 285 pg/mL (ref 211–911)

## 2013-08-21 LAB — BASIC METABOLIC PANEL
BUN: 13 mg/dL (ref 6–23)
CO2: 27 mEq/L (ref 19–32)
Chloride: 104 mEq/L (ref 96–112)
Glucose, Bld: 93 mg/dL (ref 70–99)
Potassium: 3.6 mEq/L (ref 3.5–5.1)

## 2013-08-22 ENCOUNTER — Other Ambulatory Visit: Payer: Self-pay | Admitting: Internal Medicine

## 2013-08-22 MED ORDER — VITAMIN B-12 1000 MCG SL SUBL: 1.0000 | SUBLINGUAL_TABLET | Freq: Every day | SUBLINGUAL | Status: DC

## 2013-08-25 ENCOUNTER — Telehealth: Payer: Self-pay | Admitting: *Deleted

## 2013-08-25 NOTE — Telephone Encounter (Signed)
Pt called requesting what medication is for.  Returned pts call, left message on VM requesting return call.

## 2013-09-02 ENCOUNTER — Encounter: Payer: Self-pay | Admitting: Internal Medicine

## 2013-09-02 ENCOUNTER — Ambulatory Visit (INDEPENDENT_AMBULATORY_CARE_PROVIDER_SITE_OTHER): Payer: Managed Care, Other (non HMO) | Admitting: Internal Medicine

## 2013-09-02 VITALS — BP 120/72 | HR 75 | Temp 98.1°F | Ht 65.0 in | Wt 142.5 lb

## 2013-09-02 DIAGNOSIS — R739 Hyperglycemia, unspecified: Secondary | ICD-10-CM

## 2013-09-02 DIAGNOSIS — N529 Male erectile dysfunction, unspecified: Secondary | ICD-10-CM

## 2013-09-02 DIAGNOSIS — G43909 Migraine, unspecified, not intractable, without status migrainosus: Secondary | ICD-10-CM

## 2013-09-02 DIAGNOSIS — E559 Vitamin D deficiency, unspecified: Secondary | ICD-10-CM

## 2013-09-02 DIAGNOSIS — R7309 Other abnormal glucose: Secondary | ICD-10-CM

## 2013-09-02 DIAGNOSIS — E739 Lactose intolerance, unspecified: Secondary | ICD-10-CM

## 2013-09-02 DIAGNOSIS — J309 Allergic rhinitis, unspecified: Secondary | ICD-10-CM

## 2013-09-02 DIAGNOSIS — E538 Deficiency of other specified B group vitamins: Secondary | ICD-10-CM

## 2013-09-02 DIAGNOSIS — I10 Essential (primary) hypertension: Secondary | ICD-10-CM

## 2013-09-02 DIAGNOSIS — R7989 Other specified abnormal findings of blood chemistry: Secondary | ICD-10-CM

## 2013-09-02 MED ORDER — TADALAFIL 5 MG PO TABS: 5.0000 mg | ORAL_TABLET | Freq: Every day | ORAL | Status: DC | PRN

## 2013-09-02 MED ORDER — ERGOCALCIFEROL 1.25 MG (50000 UT) PO CAPS: 50000.0000 [IU] | ORAL_CAPSULE | ORAL | Status: DC

## 2013-09-02 NOTE — Assessment & Plan Note (Signed)
Labs

## 2013-09-02 NOTE — Assessment & Plan Note (Signed)
Started on Rx

## 2013-09-02 NOTE — Assessment & Plan Note (Signed)
Started SL B12

## 2013-09-02 NOTE — Patient Instructions (Signed)
   Milk free trial (no milk, ice cream, cheese and yogurt) for 4-6 weeks. OK to use almond, coconut, rice milk. "Almond breeze" brand tastes good.  

## 2013-09-02 NOTE — Assessment & Plan Note (Signed)
Risks associated with treatment noncompliance were discussed. Compliance was encouraged. Re-start Bystolic Labs

## 2013-09-10 ENCOUNTER — Encounter: Payer: Self-pay | Admitting: Internal Medicine

## 2013-09-10 NOTE — Progress Notes (Signed)
Patient ID: George [name], male   DOB: Aug 09, 1966, 47 y.o.   MRN: [id number]   Subjective:    HPI   F/u anxiety, ED He is re-married - stress in the marriage again F/u allergies, migraines  BP Readings from Last 3 Encounters:  09/02/13 120/72  03/02/13 118/86  09/01/12 100/70   Wt Readings from Last 3 Encounters:  09/02/13 142 lb 8 oz (64.638 kg)  03/02/13 144 lb (65.318 kg)  09/01/12 142 lb (64.411 kg)     Review of Systems  Constitutional: Negative for appetite change, fatigue and unexpected weight change.  HENT: Negative for congestion, nosebleeds, sneezing, sore throat and trouble swallowing.   Eyes: Negative for itching and visual disturbance.  Respiratory: Negative for cough.   Cardiovascular: Negative for chest pain, palpitations and leg swelling.  Gastrointestinal: Negative for nausea, diarrhea, blood in stool and abdominal distention.  Genitourinary: Negative for frequency and hematuria.  Musculoskeletal: Negative for back pain, gait problem, joint swelling and neck pain.  Skin: Negative for rash.  Neurological: Negative for dizziness, tremors, speech difficulty and weakness.  Psychiatric/Behavioral: Negative for sleep disturbance, dysphoric mood and agitation. The patient is not nervous/anxious.        Objective:   Physical Exam  Constitutional: He is oriented to person, place, and time. He appears well-developed.  HENT:  Mouth/Throat: Oropharynx is clear and moist.  Eyes: Conjunctivae are normal. Pupils are equal, round, and reactive to light.  Neck: Normal range of motion. No JVD present. No thyromegaly present.  Cardiovascular: Normal rate, regular rhythm, normal heart sounds and intact distal pulses.  Exam reveals no gallop and no friction rub.   No murmur heard. Pulmonary/Chest: Effort normal and breath sounds normal. No respiratory distress. He has no wheezes. He has no rales. He exhibits no tenderness.  Abdominal: Soft. Bowel sounds are normal. He  exhibits no distension and no mass. There is no tenderness. There is no rebound and no guarding.  Genitourinary: Penis normal.  Testicles are ok  Musculoskeletal: Normal range of motion. He exhibits no edema and no tenderness.  Lymphadenopathy:    He has no cervical adenopathy.  Neurological: He is alert and oriented to person, place, and time. He has normal reflexes. No cranial nerve deficit. He exhibits normal muscle tone. Coordination normal.  Skin: Skin is warm and dry. No rash noted.  Psychiatric: His behavior is normal. Judgment and thought content normal.  anxious     Lab Results  Component Value Date   WBC 3.9* 08/21/2013   HGB 12.5* 08/21/2013   HCT 37.0* 08/21/2013   PLT 255.0 08/21/2013   GLUCOSE 93 08/21/2013   CHOL 167 02/20/2013   TRIG 112.0 02/20/2013   HDL 66.90 02/20/2013   LDLCALC 78 02/20/2013   ALT 21 02/20/2013   AST 22 02/20/2013   NA 138 08/21/2013   K 3.6 08/21/2013   CL 104 08/21/2013   CREATININE 0.7 08/21/2013   BUN 13 08/21/2013   CO2 27 08/21/2013   TSH 1.34 02/20/2013   PSA 0.46 02/20/2013         Assessment & Plan:

## 2013-09-10 NOTE — Assessment & Plan Note (Signed)
Continue with current prn prescription therapy as reflected on the Med list.  

## 2013-09-10 NOTE — Assessment & Plan Note (Signed)
Continue with current prescription therapy as reflected on the Med list.  

## 2013-09-10 NOTE — Assessment & Plan Note (Signed)
Watching  

## 2013-09-10 NOTE — Assessment & Plan Note (Signed)
Diet discussed 

## 2013-10-02 ENCOUNTER — Telehealth: Payer: Self-pay | Admitting: *Deleted

## 2013-10-02 MED ORDER — PAROXETINE HCL 10 MG PO TABS: 10.0000 mg | ORAL_TABLET | ORAL | Status: DC

## 2013-10-02 NOTE — Telephone Encounter (Signed)
Left message on VM rx sent. 

## 2013-10-02 NOTE — Telephone Encounter (Signed)
Done. Thx.

## 2013-10-02 NOTE — Telephone Encounter (Signed)
Pt called requesting Paxil refilled.  Please advise

## 2014-02-23 ENCOUNTER — Ambulatory Visit (INDEPENDENT_AMBULATORY_CARE_PROVIDER_SITE_OTHER): Payer: Managed Care, Other (non HMO) | Admitting: Internal Medicine

## 2014-02-23 ENCOUNTER — Encounter: Payer: Self-pay | Admitting: Internal Medicine

## 2014-02-23 VITALS — BP 110/64 | HR 88 | Temp 96.7°F | Wt 140.0 lb

## 2014-02-23 DIAGNOSIS — K644 Residual hemorrhoidal skin tags: Secondary | ICD-10-CM

## 2014-02-23 DIAGNOSIS — E538 Deficiency of other specified B group vitamins: Secondary | ICD-10-CM

## 2014-02-23 DIAGNOSIS — E559 Vitamin D deficiency, unspecified: Secondary | ICD-10-CM

## 2014-02-23 DIAGNOSIS — K649 Unspecified hemorrhoids: Secondary | ICD-10-CM | POA: Insufficient documentation

## 2014-02-23 MED ORDER — HYDROCORTISONE 2.5 % RE CREA: TOPICAL_CREAM | RECTAL | Status: DC

## 2014-02-23 NOTE — Progress Notes (Signed)
Pre visit review using our clinic review tool, if applicable. No additional management support is needed unless otherwise documented below in the visit note. 

## 2014-02-23 NOTE — Assessment & Plan Note (Signed)
Continue with current prescription therapy as reflected on the Med list.  

## 2014-02-23 NOTE — Assessment & Plan Note (Signed)
4/15 7 o'clock Proctocort bid cream

## 2014-02-23 NOTE — Patient Instructions (Signed)
Stool softner

## 2014-05-25 ENCOUNTER — Ambulatory Visit (INDEPENDENT_AMBULATORY_CARE_PROVIDER_SITE_OTHER): Payer: Managed Care, Other (non HMO) | Admitting: Internal Medicine

## 2014-05-25 ENCOUNTER — Encounter: Payer: Self-pay | Admitting: Internal Medicine

## 2014-05-25 VITALS — BP 120/80 | HR 68 | Temp 98.4°F | Resp 16 | Wt 133.0 lb

## 2014-05-25 DIAGNOSIS — E538 Deficiency of other specified B group vitamins: Secondary | ICD-10-CM

## 2014-05-25 NOTE — Progress Notes (Signed)
Pre visit review using our clinic review tool, if applicable. No additional management support is needed unless otherwise documented below in the visit note. 

## 2014-05-25 NOTE — Assessment & Plan Note (Signed)
Continue with current prescription therapy as reflected on the Med list.  

## 2014-05-25 NOTE — Progress Notes (Signed)
   Subjective:    HPI   F/u anxiety, ED  He is re-married - stress in the marriage again, now separated x 5 mo Lost wt F/u allergies, migraines  BP Readings from Last 3 Encounters:  05/25/14 88/62  02/23/14 110/64  09/02/13 120/72   Wt Readings from Last 3 Encounters:  05/25/14 133 lb (60.328 kg)  02/23/14 140 lb (63.504 kg)  09/02/13 142 lb 8 oz (64.638 kg)     Review of Systems  Constitutional: Negative for appetite change, fatigue and unexpected weight change.  HENT: Negative for congestion, nosebleeds, sneezing, sore throat and trouble swallowing.   Eyes: Negative for itching and visual disturbance.  Respiratory: Negative for cough.   Cardiovascular: Negative for chest pain, palpitations and leg swelling.  Gastrointestinal: Negative for nausea, diarrhea, blood in stool and abdominal distention.  Genitourinary: Negative for frequency and hematuria.  Musculoskeletal: Negative for back pain, gait problem, joint swelling and neck pain.  Skin: Negative for rash.  Neurological: Negative for dizziness, tremors, speech difficulty and weakness.  Psychiatric/Behavioral: Negative for sleep disturbance, dysphoric mood and agitation. The patient is not nervous/anxious.        Objective:   Physical Exam  Constitutional: He is oriented to person, place, and time. He appears well-developed.  HENT:  Mouth/Throat: Oropharynx is clear and moist.  Eyes: Conjunctivae are normal. Pupils are equal, round, and reactive to light.  Neck: Normal range of motion. No JVD present. No thyromegaly present.  Cardiovascular: Normal rate, regular rhythm, normal heart sounds and intact distal pulses.  Exam reveals no gallop and no friction rub.   No murmur heard. Pulmonary/Chest: Effort normal and breath sounds normal. No respiratory distress. He has no wheezes. He has no rales. He exhibits no tenderness.  Abdominal: Soft. Bowel sounds are normal. He exhibits no distension and no mass. There is no  tenderness. There is no rebound and no guarding.  Genitourinary: Penis normal.  Testicles are ok  Musculoskeletal: Normal range of motion. He exhibits no edema and no tenderness.  Lymphadenopathy:    He has no cervical adenopathy.  Neurological: He is alert and oriented to person, place, and time. He has normal reflexes. No cranial nerve deficit. He exhibits normal muscle tone. Coordination normal.  Skin: Skin is warm and dry. No rash noted.  Psychiatric: His behavior is normal. Judgment and thought content normal.  anxious  Sad   Lab Results  Component Value Date   WBC 3.9* 08/21/2013   HGB 12.5* 08/21/2013   HCT 37.0* 08/21/2013   PLT 255.0 08/21/2013   GLUCOSE 93 08/21/2013   CHOL 167 02/20/2013   TRIG 112.0 02/20/2013   HDL 66.90 02/20/2013   LDLCALC 78 02/20/2013   ALT 21 02/20/2013   AST 22 02/20/2013   NA 138 08/21/2013   K 3.6 08/21/2013   CL 104 08/21/2013   CREATININE 0.7 08/21/2013   BUN 13 08/21/2013   CO2 27 08/21/2013   TSH 1.34 02/20/2013   PSA 0.46 02/20/2013         Assessment & Plan:

## 2014-06-02 ENCOUNTER — Ambulatory Visit: Payer: Self-pay | Admitting: Internal Medicine

## 2014-07-26 ENCOUNTER — Encounter: Payer: Self-pay | Admitting: Internal Medicine

## 2014-07-26 ENCOUNTER — Ambulatory Visit (INDEPENDENT_AMBULATORY_CARE_PROVIDER_SITE_OTHER): Payer: Managed Care, Other (non HMO) | Admitting: Internal Medicine

## 2014-07-26 VITALS — BP 108/72 | HR 75 | Temp 98.1°F | Wt 138.0 lb

## 2014-07-26 DIAGNOSIS — Z0000 Encounter for general adult medical examination without abnormal findings: Secondary | ICD-10-CM

## 2014-07-26 DIAGNOSIS — Z23 Encounter for immunization: Secondary | ICD-10-CM

## 2014-07-26 DIAGNOSIS — N529 Male erectile dysfunction, unspecified: Secondary | ICD-10-CM

## 2014-07-26 DIAGNOSIS — F411 Generalized anxiety disorder: Secondary | ICD-10-CM

## 2014-07-26 DIAGNOSIS — E538 Deficiency of other specified B group vitamins: Secondary | ICD-10-CM

## 2014-07-26 DIAGNOSIS — E559 Vitamin D deficiency, unspecified: Secondary | ICD-10-CM

## 2014-07-26 NOTE — Assessment & Plan Note (Signed)
labs

## 2014-07-26 NOTE — Progress Notes (Deleted)
Pre visit review using our clinic review tool, if applicable. No additional management support is needed unless otherwise documented below in the visit note. 

## 2014-08-01 ENCOUNTER — Encounter: Payer: Self-pay | Admitting: Internal Medicine

## 2014-08-01 NOTE — Assessment & Plan Note (Signed)
Chronic w/OCD traits - better. Not taking Paroxetine

## 2014-08-01 NOTE — Progress Notes (Signed)
   Subjective:    HPI   F/u anxiety, ED  He is  separated  F/u allergies, migraines, vit D deviciency  BP Readings from Last 3 Encounters:  07/26/14 108/72  05/25/14 120/80  02/23/14 110/64   Wt Readings from Last 3 Encounters:  07/26/14 138 lb (62.596 kg)  05/25/14 133 lb (60.328 kg)  02/23/14 140 lb (63.504 kg)     Review of Systems  Constitutional: Negative for appetite change, fatigue and unexpected weight change.  HENT: Negative for congestion, nosebleeds, sneezing, sore throat and trouble swallowing.   Eyes: Negative for itching and visual disturbance.  Respiratory: Negative for cough.   Cardiovascular: Negative for chest pain, palpitations and leg swelling.  Gastrointestinal: Negative for nausea, diarrhea, blood in stool and abdominal distention.  Genitourinary: Negative for frequency and hematuria.  Musculoskeletal: Negative for back pain, gait problem, joint swelling and neck pain.  Skin: Negative for rash.  Neurological: Negative for dizziness, tremors, speech difficulty and weakness.  Psychiatric/Behavioral: Negative for sleep disturbance, dysphoric mood and agitation. The patient is not nervous/anxious.        Objective:   Physical Exam  Constitutional: He is oriented to person, place, and time. He appears well-developed.  HENT:  Mouth/Throat: Oropharynx is clear and moist.  Eyes: Conjunctivae are normal. Pupils are equal, round, and reactive to light.  Neck: Normal range of motion. No JVD present. No thyromegaly present.  Cardiovascular: Normal rate, regular rhythm, normal heart sounds and intact distal pulses.  Exam reveals no gallop and no friction rub.   No murmur heard. Pulmonary/Chest: Effort normal and breath sounds normal. No respiratory distress. He has no wheezes. He has no rales. He exhibits no tenderness.  Abdominal: Soft. Bowel sounds are normal. He exhibits no distension and no mass. There is no tenderness. There is no rebound and no  guarding.  Musculoskeletal: Normal range of motion. He exhibits no edema and no tenderness.  Lymphadenopathy:    He has no cervical adenopathy.  Neurological: He is alert and oriented to person, place, and time. He has normal reflexes. No cranial nerve deficit. He exhibits normal muscle tone. Coordination normal.  Skin: Skin is warm and dry. No rash noted.  Psychiatric: His behavior is normal. Judgment and thought content normal.  Not anxious  Not sad   Lab Results  Component Value Date   WBC 3.9* 08/21/2013   HGB 12.5* 08/21/2013   HCT 37.0* 08/21/2013   PLT 255.0 08/21/2013   GLUCOSE 93 08/21/2013   CHOL 167 02/20/2013   TRIG 112.0 02/20/2013   HDL 66.90 02/20/2013   LDLCALC 78 02/20/2013   ALT 21 02/20/2013   AST 22 02/20/2013   NA 138 08/21/2013   K 3.6 08/21/2013   CL 104 08/21/2013   CREATININE 0.7 08/21/2013   BUN 13 08/21/2013   CO2 27 08/21/2013   TSH 1.34 02/20/2013   PSA 0.46 02/20/2013         Assessment & Plan:

## 2014-08-01 NOTE — Assessment & Plan Note (Signed)
Continue with current prescription therapy as reflected on the Med list.  

## 2014-08-01 NOTE — Assessment & Plan Note (Signed)
Continue with current prn therapy as reflected on the Med list.  

## 2014-11-03 ENCOUNTER — Ambulatory Visit (INDEPENDENT_AMBULATORY_CARE_PROVIDER_SITE_OTHER): Payer: Managed Care, Other (non HMO) | Admitting: Family Medicine

## 2014-11-03 ENCOUNTER — Encounter: Payer: Self-pay | Admitting: Family Medicine

## 2014-11-03 VITALS — BP 120/70 | HR 90 | Temp 98.7°F | Ht 65.0 in | Wt 134.2 lb

## 2014-11-03 DIAGNOSIS — M7552 Bursitis of left shoulder: Secondary | ICD-10-CM | POA: Insufficient documentation

## 2014-11-03 MED ORDER — NAPROXEN 500 MG PO TABS: 500.0000 mg | ORAL_TABLET | Freq: Two times a day (BID) | ORAL | Status: DC

## 2014-11-03 NOTE — Assessment & Plan Note (Signed)
Patient was given an injection today. Patient tolerated the procedure well with about 50% improvement in pain. We discussed with patient at great length. Patient knows that this will take some time with having some of the rotator cuff tear noted. We discussed an icing regimen, home exercises, as well as over-the-counter medications a could be beneficial. Patient will do a ten-day do dose of anti-inflammatories and discussed with patient it does hurt his stomach to stop immediately. Patient is going to try to do this and avoid heavy lifting. Patient was given an exercise prescription. Patient and will come back and see me again in 3 weeks for further evaluation and treatment.

## 2014-11-03 NOTE — Progress Notes (Signed)
Tawana ScaleZach [name] D.O. [facility] [address] [place], [place] [address] Phone: [phone] Subjective:    I'm seeing this patient by the request  of:  Sonda PrimesAlex Plotnikov, MD   CC: Shoulder pain left  OZH:[id number]:Subjective Beckey DowningRoderick [name] is a 48 y.o. male coming in with complaint of left shoulder pain. Patient was having a history of right shoulder pain multiple years ago and did have a rotator cuff tear. This did heal on its own. Patient states unfortunately over the course last 3 months she started having some mild pain with range of motion. Patient notices it after doing a lot of lifting at the gym. She states it is mostly sore in certain range of motion gives him difficulty. 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 Patient denies any radiation down the arm or any numbness or tingling. Denies any neck pain that seems to be associated with it. Discuss it is more of a dull aching sensation that is waking him up at night. Patient is a severity of 8 out of 10. Not responding over-the-counter medications.     Past medical history, social, surgical and family history all reviewed in electronic medical record.   Review of Systems: No headache, visual changes, nausea, vomiting, diarrhea, constipation, dizziness, abdominal pain, skin rash, fevers, chills, night sweats, weight loss, swollen lymph nodes, body aches, joint swelling, muscle aches, chest pain, shortness of breath, mood changes.   Objective Blood pressure 120/70, pulse 90, temperature 98.7 F (37.1 C), temperature source Oral, height 5\' 5"  (1.651 m), weight 134 lb 4 oz (60.895 kg), SpO2 96 %.  General: No apparent distress alert and oriented x3 mood and affect normal, dressed appropriately.  HEENT: Pupils equal, extraocular movements intact  Respiratory: Patient's speak in full sentences and does not appear short of breath  Cardiovascular: No lower extremity edema, non tender, no erythema    Skin: Warm dry intact with no signs of infection or rash on extremities or on axial skeleton.  Abdomen: Soft nontender  Neuro: Cranial nerves II through XII are intact, neurovascularly intact in all extremities with 2+ DTRs and 2+ pulses.  Lymph: No lymphadenopathy of posterior or anterior cervical chain or axillae bilaterally.  Gait normal with good balance and coordination.  MSK:  Non tender with full range of motion and good stability and symmetric strength and tone of  elbows, wrist, hip, knee and ankles bilaterally.  Shoulder: left Inspection reveals no abnormalities, atrophy or asymmetry. Palpation is normal with no tenderness over AC joint or bicipital groove. ROM is full in all planes passively. Rotator cuff strength normal throughout. signs of impingement with positive Neer and Hawkin's tests, but negative empty can sign. Speeds and Yergason's tests normal. No labral pathology noted with negative Obrien's, negative clunk and good stability. Normal scapular function observed. No painful arc and no drop arm sign. No apprehension sign  MSK US performed of: left This study was ordered, performed, and interpreted by Terrilee FilesZach [name] D.O.  Shoulder:   Supraspinatus:  Mild to moderate intersubstance tearing noted but no true retraction Bursal bulge seen with shoulder abduction on impingement view. Infraspinatus:  Appears normal on long and transverse views. Significant increase in Doppler flow Subscapularis:  Moderate intersubstance tearing noted views. Positive bursa Teres Minor:  Appears normal on long and transverse views. AC joint:  Capsule undistended, no geyser sign. Glenohumeral Joint:  Appears normal without effusion. Glenoid Labrum:  Intact without visualized tears.  Biceps Tendon:  Appears normal on long and transverse views, no fraying of tendon, tendon located in intertubercular groove, no subluxation with shoulder internal or external rotation.  Impression: Subacromial  bursitis with mild intersubstance tearing of the subscapularis and supraspinatus but no true retraction.  Procedure: Real-time Ultrasound Guided Injection of left glenohumeral joint Device: GE Logiq E  Ultrasound guided injection is preferred based studies that show increased duration, increased effect, greater accuracy, decreased procedural pain, increased response rate with ultrasound guided versus blind injection.  Verbal informed consent obtained.  Time-out conducted.  Noted no overlying erythema, induration, or other signs of local infection.  Skin prepped in a sterile fashion.  Local anesthesia: Topical Ethyl chloride.  With sterile technique and under real time ultrasound guidance:  Joint visualized.  23g 1  inch needle inserted posterior approach. Pictures taken for needle placement. Patient did have injection of 2 cc of 1% lidocaine, 2 cc of 0.5% Marcaine, and 1.0 cc of Kenalog 40 mg/dL. Completed without difficulty  Pain immediately resolved suggesting accurate placement of the medication.  Advised to call if fevers/chills, erythema, induration, drainage, or persistent bleeding.  Images permanently stored and available for review in the ultrasound unit.  Impression: Technically successful ultrasound guided injection.     Impression and Recommendations:     This case required medical decision making of moderate complexity.

## 2014-11-03 NOTE — Patient Instructions (Addendum)
Good to see you.  Happy holidays! Ice 20 minutes 2 times daily. Usually after activity and before bed. Naproxen twice daily for 10 days.  Exercises 3 times a week.   See me again in 3 weeks.

## 2014-11-03 NOTE — Progress Notes (Signed)
Pre visit review using our clinic review tool, if applicable. No additional management support is needed unless otherwise documented below in the visit note. 

## 2014-11-10 ENCOUNTER — Ambulatory Visit: Payer: Managed Care, Other (non HMO) | Admitting: Internal Medicine

## 2014-11-26 ENCOUNTER — Ambulatory Visit: Payer: Managed Care, Other (non HMO) | Admitting: Family Medicine

## 2014-11-29 ENCOUNTER — Ambulatory Visit: Payer: Managed Care, Other (non HMO) | Admitting: Family Medicine

## 2014-12-13 ENCOUNTER — Ambulatory Visit: Payer: Managed Care, Other (non HMO) | Admitting: Family Medicine

## 2015-01-21 ENCOUNTER — Ambulatory Visit (INDEPENDENT_AMBULATORY_CARE_PROVIDER_SITE_OTHER): Payer: Managed Care, Other (non HMO) | Admitting: Family

## 2015-01-21 ENCOUNTER — Encounter: Payer: Self-pay | Admitting: Family

## 2015-01-21 VITALS — BP 110/72 | HR 80 | Temp 98.4°F | Resp 18 | Ht 66.0 in | Wt 133.8 lb

## 2015-01-21 DIAGNOSIS — M79604 Pain in right leg: Secondary | ICD-10-CM

## 2015-01-21 DIAGNOSIS — M79605 Pain in left leg: Secondary | ICD-10-CM

## 2015-01-21 MED ORDER — TADALAFIL 5 MG PO TABS: 5.0000 mg | ORAL_TABLET | Freq: Every day | ORAL | Status: DC | PRN

## 2015-01-21 MED ORDER — METHYLPREDNISOLONE 4 MG PO KIT: PACK | ORAL | Status: DC

## 2015-01-21 NOTE — Progress Notes (Signed)
Subjective:    Patient ID: George [name], male    DOB: May 25, 1966, 49 y.o.   MRN: [id number]  Chief Complaint  Patient presents with  . Leg Pain    x3 days when sitting down his legs are fine but when he stands up his legs hurt at the back, he has a bluish/purple mark on one of his thighs, concerns of blood clot    HPI:  George [name] is a 49 y.o. male who presents today for an office visit.   This is a new problem. Associated symptoms of bluish/purple mark on his right thigh and now experiencing bilateral leg pain when he stands up. Notes that when he is sitting down the pain is relieved. Pain is described as sharp and located in the posterior aspect of both legs that goes from his hips to his feet. Pain intensity is a 7/10. Has tried OTC muscle cream which has not helped at all. Indicates the spot that he has does not really hurt. Denies any trauma to his legs or back.   Allergies  Allergen Reactions  . Aspirin   . Paroxetine     REACTION: tired    Current Outpatient Prescriptions on File Prior to Visit  Medication Sig Dispense Refill  . Cholecalciferol 1000 UNITS tablet Take 1,000 Units by mouth daily.      . Cyanocobalamin (VITAMIN B-12) 1000 MCG SUBL Place 1 tablet (1,000 mcg total) under the tongue daily. 100 tablet 3  . hydrocortisone (ANUSOL-HC) 2.5 % rectal cream Use bid x 10 d 60 g 1  . naproxen (NAPROSYN) 500 MG tablet Take 1 tablet (500 mg total) by mouth 2 (two) times daily with a meal. 30 tablet 0  . tadalafil (CIALIS) 5 MG tablet Take 1 tablet (5 mg total) by mouth daily as needed. 30 tablet 3   No current facility-administered medications on file prior to visit.    Past Medical History  Diagnosis Date  . Anxiety   . OCD (obsessive compulsive disorder)   . Depression   . OCD (obsessive compulsive disorder)   . Eczema   . Allergy   . ED (erectile dysfunction)   . H/O: hematuria     Review of Systems  Constitutional: Negative for fever and chills.    Musculoskeletal: Positive for myalgias and gait problem.  Neurological: Negative for numbness.      Objective:    BP 110/72 mmHg  Pulse 80  Temp(Src) 98.4 F (36.9 C) (Oral)  Resp 18  Ht 5\' 6"  (1.676 m)  Wt 133 lb 12.8 oz (60.691 kg)  BMI 21.61 kg/m2  SpO2 96% Nursing note and vital signs reviewed.  Physical Exam  Constitutional: He is oriented to person, place, and time. He appears well-developed and well-nourished. No distress.  Cardiovascular: Normal rate, regular rhythm, normal heart sounds and intact distal pulses.   Pulmonary/Chest: Effort normal and breath sounds normal.  Musculoskeletal:  No obvious deformity, discoloration, or edema of legs noted. No palpable tenderness able to be elicited. Strength is 5+ bilaterally in flexion and extension. Sensation and pulses are intact and appropriate bilaterally. Range of motion is intact and appropriate.  Neurological: He is alert and oriented to person, place, and time.  Skin: Skin is warm and dry. Rash noted.  Small 1" x 1" oval discoloration noted on the right lateral anterior thigh. It is blanchable to touch. Appears purplish with some green and yellow. No discharge.  Psychiatric: He has a normal mood and affect. His behavior  is normal. Judgment and thought content normal.       Assessment & Plan:

## 2015-01-21 NOTE — Assessment & Plan Note (Signed)
Idiopathic leg pain. Given risk factors low concern for blood clots. Question possible radiculopathy from low back. Small area on right lateral thigh consistent with a contusion. No evidence of insect bite or any other pathology. Continue Naprosyn twice daily as needed. Start Medrol Dosepak for inflammation. Patient instructed to seek further care if symptoms do not improve or worsen.

## 2015-01-21 NOTE — Patient Instructions (Signed)
Thank you for choosing  HealthCare.  Summary/Instructions:  Your prescription(s) have been submitted to your pharmacy or been printed and provided for you. Please take as directed and contact our office if you believe you are having problem(s) with the medication(s) or have any questions.  If your symptoms worsen or fail to improve, please contact our office for further instruction, or in case of emergency go directly to the emergency room at the closest medical facility.     

## 2015-01-21 NOTE — Progress Notes (Signed)
Pre visit review using our clinic review tool, if applicable. No additional management support is needed unless otherwise documented below in the visit note. 

## 2015-02-14 ENCOUNTER — Encounter: Payer: Self-pay | Admitting: Internal Medicine

## 2015-02-14 ENCOUNTER — Ambulatory Visit (INDEPENDENT_AMBULATORY_CARE_PROVIDER_SITE_OTHER): Payer: Managed Care, Other (non HMO) | Admitting: Internal Medicine

## 2015-02-14 VITALS — BP 100/60 | HR 82 | Wt 132.0 lb

## 2015-02-14 DIAGNOSIS — N529 Male erectile dysfunction, unspecified: Secondary | ICD-10-CM | POA: Insufficient documentation

## 2015-02-14 DIAGNOSIS — M79604 Pain in right leg: Secondary | ICD-10-CM | POA: Diagnosis not present

## 2015-02-14 DIAGNOSIS — N528 Other male erectile dysfunction: Secondary | ICD-10-CM | POA: Diagnosis not present

## 2015-02-14 DIAGNOSIS — M79605 Pain in left leg: Secondary | ICD-10-CM | POA: Diagnosis not present

## 2015-02-14 MED ORDER — SILDENAFIL CITRATE 100 MG PO TABS
100.0000 mg | ORAL_TABLET | ORAL | Status: DC | PRN
Start: 2015-02-14 — End: 2016-12-03

## 2015-02-14 NOTE — Progress Notes (Signed)
Pre visit review using our clinic review tool, if applicable. No additional management support is needed unless otherwise documented below in the visit note. 

## 2015-02-14 NOTE — Assessment & Plan Note (Signed)
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

## 2015-02-14 NOTE — Assessment & Plan Note (Signed)
Viagra prn 

## 2015-02-14 NOTE — Progress Notes (Signed)
   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  HENT: Negative for congestion, nosebleeds, sneezing, sore throat and trouble swallowing.   Eyes: Negative for itching and visual disturbance.  Respiratory: Negative for cough.   Cardiovascular: Negative for chest pain, palpitations and leg swelling.  Gastrointestinal: Negative for nausea, diarrhea, blood in stool and abdominal distention.  Genitourinary: Negative for frequency and hematuria.  Musculoskeletal: Negative for back pain, joint swelling, gait problem and neck pain.  Skin: Negative for rash.  Neurological: Negative for dizziness, tremors, speech difficulty and weakness.  Psychiatric/Behavioral: Negative for sleep disturbance, dysphoric mood and agitation. The patient is not nervous/anxious.        Objective:   Physical Exam  Constitutional: He is oriented to person, place, and time. He appears well-developed.  HENT:  Mouth/Throat: Oropharynx is clear and moist.  Eyes: Conjunctivae are normal. Pupils are equal, round, and reactive to light.  Neck: Normal range of motion. No JVD present. No thyromegaly present.  Cardiovascular: Normal rate, regular rhythm, normal heart sounds and intact distal pulses.  Exam reveals no gallop and no friction rub.   No murmur heard. Pulmonary/Chest: Effort normal and breath sounds normal. No respiratory distress. He has no wheezes. He has no rales. He exhibits no tenderness.  Abdominal: Soft. Bowel sounds are normal. He exhibits no distension and no mass.  There is no tenderness. There is no rebound and no guarding.  Musculoskeletal: Normal range of motion. He exhibits no edema or tenderness.  Lymphadenopathy:    He has no cervical adenopathy.  Neurological: He is alert and oriented to person, place, and time. He has normal reflexes. No cranial nerve deficit. He exhibits normal muscle tone. Coordination normal.  Skin: Skin is warm and dry. No rash noted.  Psychiatric: His behavior is normal. Judgment and thought content normal.  Not anxious  Not sad B thighs stiff posteriorly   Lab Results  Component Value Date   WBC 3.9* 08/21/2013   HGB 12.5* 08/21/2013   HCT 37.0* 08/21/2013   PLT 255.0 08/21/2013   GLUCOSE 93 08/21/2013   CHOL 167 02/20/2013   TRIG 112.0 02/20/2013   HDL 66.90 02/20/2013   LDLCALC 78 02/20/2013   ALT 21 02/20/2013   AST 22 02/20/2013   NA 138 08/21/2013   K 3.6 08/21/2013   CL 104 08/21/2013   CREATININE 0.7 08/21/2013   BUN 13 08/21/2013   CO2 27 08/21/2013   TSH 1.34 02/20/2013   PSA 0.46 02/20/2013         Assessment & Plan:

## 2015-03-16 ENCOUNTER — Ambulatory Visit (INDEPENDENT_AMBULATORY_CARE_PROVIDER_SITE_OTHER): Payer: Managed Care, Other (non HMO) | Admitting: Internal Medicine

## 2015-03-16 ENCOUNTER — Encounter: Payer: Self-pay | Admitting: Internal Medicine

## 2015-03-16 VITALS — BP 110/68 | HR 86 | Temp 98.4°F | Wt 134.0 lb

## 2015-03-16 DIAGNOSIS — B86 Scabies: Secondary | ICD-10-CM | POA: Diagnosis not present

## 2015-03-16 MED ORDER — PERMETHRIN 5 % EX CREA: 1.0000 "application " | TOPICAL_CREAM | Freq: Once | CUTANEOUS | Status: DC

## 2015-03-16 MED ORDER — TRIAMCINOLONE ACETONIDE 0.1 % EX CREA: 1.0000 | TOPICAL_CREAM | Freq: Four times a day (QID) | CUTANEOUS | Status: DC | PRN

## 2015-03-16 NOTE — Progress Notes (Signed)
   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  HENT: Negative for congestion, nosebleeds, sneezing, sore throat and trouble swallowing.   Eyes: Negative for itching and visual disturbance.  Respiratory: Negative for cough.   Cardiovascular: Negative for chest pain, palpitations and leg swelling.  Gastrointestinal: Negative for nausea, diarrhea, blood in stool and abdominal distention.  Genitourinary: Negative for frequency and hematuria.  Musculoskeletal: Negative for back pain, joint swelling, gait problem and neck pain.  Skin: Negative for rash.  Neurological: Negative for dizziness, tremors, speech difficulty and weakness.  Psychiatric/Behavioral: Negative for sleep disturbance, dysphoric mood and agitation. The patient is not nervous/anxious.        Objective:   Physical Exam  Constitutional: He is oriented to person, place, and time. He appears well-developed.  HENT:  Mouth/Throat: Oropharynx is clear and moist.  Eyes: Conjunctivae are normal. Pupils are equal, round, and reactive to light.  Neck: Normal range of motion. No JVD present. No thyromegaly present.  Cardiovascular: Normal rate, regular rhythm, normal heart sounds and intact distal pulses.  Exam reveals no gallop and no friction rub.   No murmur heard. Pulmonary/Chest: Effort normal and breath sounds normal. No respiratory distress. He has no wheezes. He has no rales. He exhibits no tenderness.  Abdominal: Soft. Bowel sounds are normal.  He exhibits no distension and no mass. There is no tenderness. There is no rebound and no guarding.  Musculoskeletal: Normal range of motion. He exhibits no edema or tenderness.  Lymphadenopathy:    He has no cervical adenopathy.  Neurological: He is alert and oriented to person, place, and time. He has normal reflexes. No cranial nerve deficit. He exhibits normal muscle tone. Coordination normal.  Skin: Skin is warm and dry. No rash noted.  Psychiatric: His behavior is normal. Judgment and thought content normal.  Not anxious  Not sad Scattered papular 1-2 mm elements on arms and back - not many   Lab Results  Component Value Date   WBC 3.9* 08/21/2013   HGB 12.5* 08/21/2013   HCT 37.0* 08/21/2013   PLT 255.0 08/21/2013   GLUCOSE 93 08/21/2013   CHOL 167 02/20/2013   TRIG 112.0 02/20/2013   HDL 66.90 02/20/2013   LDLCALC 78 02/20/2013   ALT 21 02/20/2013   AST 22 02/20/2013   NA 138 08/21/2013   K 3.6 08/21/2013   CL 104 08/21/2013   CREATININE 0.7 08/21/2013   BUN 13 08/21/2013   CO2 27 08/21/2013   TSH 1.34 02/20/2013   PSA 0.46 02/20/2013         Assessment & Plan:

## 2015-03-16 NOTE — Progress Notes (Signed)
Pre visit review using our clinic review tool, if applicable. No additional management support is needed unless otherwise documented below in the visit note. 

## 2015-03-16 NOTE — Assessment & Plan Note (Signed)
Permethrin x1 Kenalog prn

## 2015-03-16 NOTE — Patient Instructions (Signed)
Use Claritin or benadryl for itching

## 2015-05-17 ENCOUNTER — Ambulatory Visit (INDEPENDENT_AMBULATORY_CARE_PROVIDER_SITE_OTHER): Payer: Managed Care, Other (non HMO) | Admitting: Internal Medicine

## 2015-05-17 ENCOUNTER — Encounter: Payer: Self-pay | Admitting: Internal Medicine

## 2015-05-17 ENCOUNTER — Encounter (INDEPENDENT_AMBULATORY_CARE_PROVIDER_SITE_OTHER): Payer: Self-pay

## 2015-05-17 ENCOUNTER — Other Ambulatory Visit (INDEPENDENT_AMBULATORY_CARE_PROVIDER_SITE_OTHER): Payer: Managed Care, Other (non HMO)

## 2015-05-17 VITALS — BP 108/70 | HR 88 | Ht 66.0 in | Wt 139.0 lb

## 2015-05-17 DIAGNOSIS — E538 Deficiency of other specified B group vitamins: Secondary | ICD-10-CM | POA: Diagnosis not present

## 2015-05-17 DIAGNOSIS — E559 Vitamin D deficiency, unspecified: Secondary | ICD-10-CM | POA: Diagnosis not present

## 2015-05-17 DIAGNOSIS — Z0000 Encounter for general adult medical examination without abnormal findings: Secondary | ICD-10-CM

## 2015-05-17 DIAGNOSIS — H6123 Impacted cerumen, bilateral: Secondary | ICD-10-CM

## 2015-05-17 DIAGNOSIS — N528 Other male erectile dysfunction: Secondary | ICD-10-CM | POA: Diagnosis not present

## 2015-05-17 DIAGNOSIS — R9431 Abnormal electrocardiogram [ECG] [EKG]: Secondary | ICD-10-CM

## 2015-05-17 DIAGNOSIS — H6120 Impacted cerumen, unspecified ear: Secondary | ICD-10-CM | POA: Insufficient documentation

## 2015-05-17 DIAGNOSIS — Z23 Encounter for immunization: Secondary | ICD-10-CM | POA: Diagnosis not present

## 2015-05-17 LAB — LIPID PANEL
CHOLESTEROL: 170 mg/dL (ref 0–200)
HDL: 71.7 mg/dL (ref >39.00)
LDL Cholesterol: 83 mg/dL (ref 0–99)
NonHDL: 98.3
TRIGLYCERIDES: 78 mg/dL (ref 0.0–149.0)
Total CHOL/HDL Ratio: 2
VLDL: 15.6 mg/dL (ref 0.0–40.0)

## 2015-05-17 LAB — BASIC METABOLIC PANEL
BUN: 13 mg/dL (ref 6–23)
CALCIUM: 9.4 mg/dL (ref 8.4–10.5)
CO2: 31 mEq/L (ref 19–32)
CREATININE: 0.81 mg/dL (ref 0.40–1.50)
Chloride: 102 mEq/L (ref 96–112)
GFR: 129.1 mL/min (ref >60.00)
GLUCOSE: 83 mg/dL (ref 70–99)
Potassium: 4 mEq/L (ref 3.5–5.1)
SODIUM: 139 meq/L (ref 135–145)

## 2015-05-17 LAB — CBC WITH DIFFERENTIAL/PLATELET
BASOS PCT: 0.5 % (ref 0.0–3.0)
Basophils Absolute: 0 10*3/uL (ref 0.0–0.1)
EOS PCT: 5.7 % — AB (ref 0.0–5.0)
Eosinophils Absolute: 0.3 10*3/uL (ref 0.0–0.7)
HEMATOCRIT: 40 % (ref 39.0–52.0)
Hemoglobin: 13.2 g/dL (ref 13.0–17.0)
LYMPHS ABS: 1.7 10*3/uL (ref 0.7–4.0)
LYMPHS PCT: 35.9 % (ref 12.0–46.0)
MCHC: 33.1 g/dL (ref 30.0–36.0)
MCV: 93.5 fl (ref 78.0–100.0)
MONOS PCT: 10.8 % (ref 3.0–12.0)
Monocytes Absolute: 0.5 10*3/uL (ref 0.1–1.0)
NEUTROS ABS: 2.2 10*3/uL (ref 1.4–7.7)
Neutrophils Relative %: 47.1 % (ref 43.0–77.0)
Platelets: 251 10*3/uL (ref 150.0–400.0)
RBC: 4.27 Mil/uL (ref 4.22–5.81)
RDW: 12.6 % (ref 11.5–15.5)
WBC: 4.6 10*3/uL (ref 4.0–10.5)

## 2015-05-17 LAB — URINALYSIS
BILIRUBIN URINE: NEGATIVE
HGB URINE DIPSTICK: NEGATIVE
KETONES UR: NEGATIVE
Leukocytes, UA: NEGATIVE
Nitrite: NEGATIVE
PH: 7 (ref 5.0–8.0)
Specific Gravity, Urine: 1.02 (ref 1.000–1.030)
TOTAL PROTEIN, URINE-UPE24: NEGATIVE
URINE GLUCOSE: NEGATIVE
Urobilinogen, UA: 4 — AB (ref 0.0–1.0)

## 2015-05-17 LAB — PSA: PSA: 4.97 ng/mL — AB (ref 0.10–4.00)

## 2015-05-17 LAB — HEPATIC FUNCTION PANEL
ALBUMIN: 4.3 g/dL (ref 3.5–5.2)
ALT: 20 U/L (ref 0–53)
AST: 20 U/L (ref 0–37)
Alkaline Phosphatase: 66 U/L (ref 39–117)
BILIRUBIN DIRECT: 0.3 mg/dL (ref 0.0–0.3)
BILIRUBIN TOTAL: 1.4 mg/dL — AB (ref 0.2–1.2)
Total Protein: 7.7 g/dL (ref 6.0–8.3)

## 2015-05-17 LAB — TSH: TSH: 1.41 u[IU]/mL (ref 0.35–4.50)

## 2015-05-17 NOTE — Assessment & Plan Note (Addendum)
7/16 no change from 2013 ECHO ordered

## 2015-05-17 NOTE — Assessment & Plan Note (Signed)
Viagra Rx given - prn

## 2015-05-17 NOTE — Progress Notes (Signed)
Subjective:  Patient ID: George [name], male    DOB: [birth year]  Age: 49 y.o. MRN: [id number]  CC: Annual Exam   HPI  George [name] presents for well exam. C/o ED.  Outpatient Prescriptions Prior to Visit  Medication Sig Dispense Refill  . naproxen (NAPROSYN) 500 MG tablet Take 1 tablet (500 mg total) by mouth 2 (two) times daily with a meal. 30 tablet 0  . Cholecalciferol 1000 UNITS tablet Take 1,000 Units by mouth daily.      . Cyanocobalamin (VITAMIN B-12) 1000 MCG SUBL Place 1 tablet (1,000 mcg total) under the tongue daily. (Patient not taking: Reported on 05/17/2015) 100 tablet 3  . hydrocortisone (ANUSOL-HC) 2.5 % rectal cream Use bid x 10 d (Patient not taking: Reported on 05/17/2015) 60 g 1  . permethrin (ACTICIN) 5 % cream Apply 1 application topically once. (Patient not taking: Reported on 05/17/2015) 60 g 1  . sildenafil (VIAGRA) 100 MG tablet Take 1 tablet (100 mg total) by mouth as needed for erectile dysfunction. (Patient not taking: Reported on 05/17/2015) 12 tablet 6  . triamcinolone cream (KENALOG) 0.1 % Apply 1 application topically 4 (four) times daily as needed. Itching (Patient not taking: Reported on 05/17/2015) 80 g 2   No facility-administered medications prior to visit.    ROS Review of Systems  Constitutional: Negative for appetite change, fatigue and unexpected weight change.  HENT: Negative for congestion, nosebleeds, sneezing, sore throat, trouble swallowing and voice change.   Eyes: Negative for itching and visual disturbance.  Respiratory: Negative for cough.   Cardiovascular: Negative for chest pain, palpitations and leg swelling.  Gastrointestinal: Negative for nausea, vomiting, abdominal pain, diarrhea, blood in stool and abdominal distention.  Genitourinary: Negative for urgency, frequency and hematuria.  Musculoskeletal: Negative for back pain, joint swelling, gait problem, neck pain and neck stiffness.  Skin: Negative for rash.  Neurological:  Negative for dizziness, tremors, speech difficulty and weakness.  Psychiatric/Behavioral: Negative for suicidal ideas, sleep disturbance, dysphoric mood and agitation. The patient is not nervous/anxious.     Objective:  BP 108/70 mmHg  Pulse 88  Ht  (1.676 m)  Wt 139 lb (63.05 kg)  BMI 22.45 kg/m2  SpO2 94%  BP Readings from Last 3 Encounters:  05/17/15 108/70  03/16/15 110/68  02/14/15 100/60    Wt Readings from Last 3 Encounters:  05/17/15 139 lb (63.05 kg)  03/16/15 134 lb (60.782 kg)  02/14/15 132 lb (59.875 kg)    Physical Exam  Constitutional: He is oriented to person, place, and time. He appears well-developed and well-nourished. No distress.  HENT:  Head: Normocephalic and atraumatic.  Nose: Nose normal.  Mouth/Throat: Oropharynx is clear and moist. No oropharyngeal exudate.  Eyes: Conjunctivae and EOM are normal. Pupils are equal, round, and reactive to light. Right eye exhibits no discharge. Left eye exhibits no discharge. No scleral icterus.  Neck: Normal range of motion. Neck supple. No JVD present. No tracheal deviation present. No thyromegaly present.  Cardiovascular: Normal rate, regular rhythm, normal heart sounds and intact distal pulses.  Exam reveals no gallop and no friction rub.   No murmur heard. Pulmonary/Chest: Effort normal and breath sounds normal. No stridor. No respiratory distress. He has no wheezes. He has no rales. He exhibits no tenderness.  Abdominal: Soft. Bowel sounds are normal. He exhibits no distension and no mass. There is no tenderness. There is no rebound and no guarding.  Genitourinary: Rectum normal and penis normal. Guaiac negative stool. No penile  tenderness.  Prostate 1+ NT  Musculoskeletal: Normal range of motion. He exhibits no edema or tenderness.  Lymphadenopathy:    He has no cervical adenopathy.  Neurological: He is alert and oriented to person, place, and time. He has normal reflexes. No cranial nerve deficit. He  exhibits normal muscle tone. Coordination normal.  Skin: Skin is warm and dry. No rash noted. He is not diaphoretic. No erythema. No pallor.  Psychiatric: He has a normal mood and affect. [name] behavior is normal. Judgment and thought content normal.  B wax  EKG - no change from 2013, abn  Lab Results  Component Value Date   WBC 3.9* 08/21/2013   HGB 12.5* 08/21/2013   HCT 37.0* 08/21/2013   PLT 255.0 08/21/2013   GLUCOSE 93 08/21/2013   [name] 167 02/20/2013   TRIG 112.0 02/20/2013   HDL 66.90 02/20/2013   LDLCALC 78 02/20/2013   ALT 21 02/20/2013   AST 22 02/20/2013   NA 138 08/21/2013   K 3.6 08/21/2013   CL 104 08/21/2013   CREATININE 0.7 08/21/2013   BUN 13 08/21/2013   CO2 27 08/21/2013   TSH 1.34 02/20/2013   PSA 0.46 02/20/2013    Procedure Note :     Procedure :  Ear irrigation   Indication:  Cerumen impaction B   Risks, including pain, dizziness, eardrum perforation, bleeding, infection and others as well as benefits were explained to the patient in detail. Verbal consent was obtained and the patient agreed to proceed.    We used "The Elephant Ear Irrigation Device" filled with lukewarm water for irrigation. A large amount wax was recovered. Procedure has also required manual wax removal with an ear loop.   Tolerated well. Complications: None.   Postprocedure instructions :  Call if problems.   No results found.  Assessment & Plan:   George [name] was seen today for annual exam.  Diagnoses and all orders for this visit:  B12 deficiency  Well adult exam Orders: -     EKG 12-Lead  Need for Tdap vaccination Orders: -     Tdap vaccine greater than or equal to 7yo IM  I am having George [name] Cholecalciferol, Vitamin B-12, hydrocortisone, naproxen, sildenafil, triamcinolone cream, and permethrin.  No orders of the defined types were placed in this encounter.     Follow-up: No Follow-up on file.  George [name], George [name]

## 2015-05-17 NOTE — Progress Notes (Signed)
Pre visit review using our clinic review tool, if applicable. No additional management support is needed unless otherwise documented below in the visit note. 

## 2015-05-17 NOTE — Assessment & Plan Note (Addendum)
We discussed age appropriate health related issues, including available/recomended screening tests and vaccinations. We discussed a need for adhering to healthy diet and exercise. Labs/EKG were reviewed/ordered. All questions were answered. Pt declined colonoscopy for now

## 2015-05-17 NOTE — Assessment & Plan Note (Signed)
On B12 

## 2015-05-17 NOTE — Assessment & Plan Note (Signed)
7/16 due to ear plugs, recurrent Will irrigate

## 2015-05-18 LAB — VITAMIN D 25 HYDROXY (VIT D DEFICIENCY, FRACTURES): VITD: 7.28 ng/mL — ABNORMAL LOW (ref 30.00–100.00)

## 2015-05-18 LAB — VITAMIN B12: VITAMIN B 12: 295 pg/mL (ref 211–911)

## 2015-05-19 ENCOUNTER — Other Ambulatory Visit: Payer: Managed Care, Other (non HMO)

## 2015-05-19 DIAGNOSIS — R972 Elevated prostate specific antigen [PSA]: Secondary | ICD-10-CM

## 2015-05-20 ENCOUNTER — Other Ambulatory Visit: Payer: Self-pay | Admitting: Internal Medicine

## 2015-05-20 LAB — PSA, FREE
PSA FREE: 0.17 ng/mL
PSA, Free Pct: 26 % (ref >25)

## 2015-05-20 LAB — PSA: PSA: 0.65 ng/mL (ref <=4.00)

## 2015-05-20 MED ORDER — ERGOCALCIFEROL 1.25 MG (50000 UT) PO CAPS: 50000.0000 [IU] | ORAL_CAPSULE | ORAL | Status: DC

## 2015-05-23 ENCOUNTER — Ambulatory Visit (HOSPITAL_COMMUNITY): Payer: Managed Care, Other (non HMO) | Attending: Internal Medicine

## 2015-05-23 ENCOUNTER — Other Ambulatory Visit: Payer: Self-pay

## 2015-05-23 DIAGNOSIS — R9431 Abnormal electrocardiogram [ECG] [EKG]: Secondary | ICD-10-CM | POA: Diagnosis not present

## 2015-05-23 DIAGNOSIS — I071 Rheumatic tricuspid insufficiency: Secondary | ICD-10-CM | POA: Insufficient documentation

## 2015-05-23 DIAGNOSIS — I352 Nonrheumatic aortic (valve) stenosis with insufficiency: Secondary | ICD-10-CM | POA: Diagnosis not present

## 2015-08-09 ENCOUNTER — Ambulatory Visit (INDEPENDENT_AMBULATORY_CARE_PROVIDER_SITE_OTHER): Payer: Managed Care, Other (non HMO) | Admitting: Internal Medicine

## 2015-08-09 ENCOUNTER — Encounter: Payer: Self-pay | Admitting: Internal Medicine

## 2015-08-09 VITALS — BP 130/80 | HR 80 | Temp 99.0°F | Wt 140.0 lb

## 2015-08-09 DIAGNOSIS — J301 Allergic rhinitis due to pollen: Secondary | ICD-10-CM

## 2015-08-09 DIAGNOSIS — J0101 Acute recurrent maxillary sinusitis: Secondary | ICD-10-CM | POA: Diagnosis not present

## 2015-08-09 DIAGNOSIS — E538 Deficiency of other specified B group vitamins: Secondary | ICD-10-CM

## 2015-08-09 MED ORDER — AZITHROMYCIN 250 MG PO TABS: ORAL_TABLET | ORAL | Status: DC

## 2015-08-09 MED ORDER — LORATADINE 10 MG PO TABS: 10.0000 mg | ORAL_TABLET | Freq: Every day | ORAL | Status: DC

## 2015-08-09 MED ORDER — METHYLPREDNISOLONE ACETATE 80 MG/ML IJ SUSP
80.0000 mg | Freq: Once | INTRAMUSCULAR | Status: AC
  Administered 2015-08-09: 80 mg via INTRAMUSCULAR

## 2015-08-09 NOTE — Progress Notes (Signed)
Pre visit review using our clinic review tool, if applicable. No additional management support is needed unless otherwise documented below in the visit note. 

## 2015-08-09 NOTE — Assessment & Plan Note (Signed)
Risks associated with treatment noncompliance were discussed. Compliance was encouraged. 

## 2015-08-09 NOTE — Progress Notes (Signed)
Subjective:  Patient ID: George [name], male    DOB: 1966/05/16  Age: 49 y.o. MRN: [id number]  CC: No chief complaint on file.   HPI George [name] presents for sinusitis sx's and allergies x 1 mo, yellow d/c  Outpatient Prescriptions Prior to Visit  Medication Sig Dispense Refill  . Cholecalciferol 1000 UNITS tablet Take 1,000 Units by mouth daily.      . Cyanocobalamin (VITAMIN B-12) 1000 MCG SUBL Place 1 tablet (1,000 mcg total) under the tongue daily. (Patient not taking: Reported on 08/09/2015) 100 tablet 3  . ergocalciferol (VITAMIN D2) 50000 UNITS capsule Take 1 capsule (50,000 Units total) by mouth once a week. (Patient not taking: Reported on 08/09/2015) 6 capsule 0  . hydrocortisone (ANUSOL-HC) 2.5 % rectal cream Use bid x 10 d (Patient not taking: Reported on 08/09/2015) 60 g 1  . naproxen (NAPROSYN) 500 MG tablet Take 1 tablet (500 mg total) by mouth 2 (two) times daily with a meal. (Patient not taking: Reported on 08/09/2015) 30 tablet 0  . permethrin (ACTICIN) 5 % cream Apply 1 application topically once. (Patient not taking: Reported on 08/09/2015) 60 g 1  . sildenafil (VIAGRA) 100 MG tablet Take 1 tablet (100 mg total) by mouth as needed for erectile dysfunction. (Patient not taking: Reported on 08/09/2015) 12 tablet 6  . triamcinolone cream (KENALOG) 0.1 % Apply 1 application topically 4 (four) times daily as needed. Itching (Patient not taking: Reported on 08/09/2015) 80 g 2   No facility-administered medications prior to visit.    ROS Review of Systems  Constitutional: Negative for appetite change, fatigue and unexpected weight change.  HENT: Positive for congestion, postnasal drip, sinus pressure, sneezing and sore throat. Negative for nosebleeds and trouble swallowing.   Eyes: Negative for itching and visual disturbance.  Respiratory: Positive for cough.   Cardiovascular: Negative for chest pain, palpitations and leg swelling.  Gastrointestinal: Negative for nausea,  diarrhea, blood in stool and abdominal distention.  Genitourinary: Negative for frequency and hematuria.  Musculoskeletal: Negative for back pain, joint swelling, gait problem and neck pain.  Skin: Negative for rash.  Neurological: Negative for dizziness, tremors, speech difficulty and weakness.  Psychiatric/Behavioral: Negative for sleep disturbance, dysphoric mood and agitation. The patient is not nervous/anxious.     Objective:  BP 130/80 mmHg  Pulse 80  Temp(Src) 99 F (37.2 C) (Oral)  Wt 140 lb (63.504 kg)  SpO2 98%  BP Readings from Last 3 Encounters:  08/09/15 130/80  05/17/15 108/70  03/16/15 110/68    Wt Readings from Last 3 Encounters:  08/09/15 140 lb (63.504 kg)  05/17/15 139 lb (63.05 kg)  03/16/15 134 lb (60.782 kg)    Physical Exam  Constitutional: He is oriented to person, place, and time. He appears well-developed. No distress.  NAD  HENT:  Mouth/Throat: No oropharyngeal exudate.  Eyes: Conjunctivae are normal. Pupils are equal, round, and reactive to light.  Neck: Normal range of motion. No JVD present. No thyromegaly present.  Cardiovascular: Normal rate, regular rhythm, normal heart sounds and intact distal pulses.  Exam reveals no gallop and no friction rub.   No murmur heard. Pulmonary/Chest: Effort normal and breath sounds normal. No respiratory distress. He has no wheezes. He has no rales. He exhibits no tenderness.  Abdominal: Soft. Bowel sounds are normal. He exhibits no distension and no mass. There is no tenderness. There is no rebound and no guarding.  Musculoskeletal: Normal range of motion. He exhibits no edema or tenderness.  Lymphadenopathy:  He has no cervical adenopathy.  Neurological: He is alert and oriented to person, place, and time. He has normal reflexes. No cranial nerve deficit. He exhibits normal muscle tone. He displays a negative Romberg sign. Coordination and gait normal.  Skin: Skin is warm and dry. No rash noted.    Psychiatric: He has a normal mood and affect. His behavior is normal. Judgment and thought content normal.  s  Lab Results  Component Value Date   WBC 4.6 05/17/2015   HGB 13.2 05/17/2015   HCT 40.0 05/17/2015   PLT 251.0 05/17/2015   GLUCOSE 83 05/17/2015   CHOL 170 05/17/2015   TRIG 78.0 05/17/2015   HDL 71.70 05/17/2015   LDLCALC 83 05/17/2015   ALT 20 05/17/2015   AST 20 05/17/2015   NA 139 05/17/2015   K 4.0 05/17/2015   CL 102 05/17/2015   CREATININE 0.81 05/17/2015   BUN 13 05/17/2015   CO2 31 05/17/2015   TSH 1.41 05/17/2015   PSA 0.65 05/19/2015    No results found.  Assessment & Plan:   Diagnoses and all orders for this visit:  Allergic rhinitis due to pollen -     methylPREDNISolone acetate (DEPO-MEDROL) injection 80 mg; Inject 1 mL (80 mg total) into the muscle once.  Acute recurrent maxillary sinusitis  B12 deficiency  Other orders -     loratadine (CLARITIN) 10 MG tablet; Take 1 tablet (10 mg total) by mouth daily. -     azithromycin (ZITHROMAX Z-PAK) 250 MG tablet; As directed  I am having Mr. George [name] start on loratadine and azithromycin. I am also having him maintain his Cholecalciferol, Vitamin B-12, hydrocortisone, naproxen, sildenafil, triamcinolone cream, permethrin, and ergocalciferol. We administered methylPREDNISolone acetate.  Meds ordered this encounter  Medications  . loratadine (CLARITIN) 10 MG tablet    Sig: Take 1 tablet (10 mg total) by mouth daily.    Dispense:  100 tablet    Refill:  3  . azithromycin (ZITHROMAX Z-PAK) 250 MG tablet    Sig: As directed    Dispense:  6 each    Refill:  0  . methylPREDNISolone acetate (DEPO-MEDROL) injection 80 mg    Sig:      Follow-up: No Follow-up on file.  Sonda Primes, MD

## 2015-08-09 NOTE — Assessment & Plan Note (Signed)
Zpac 

## 2015-08-09 NOTE — Assessment & Plan Note (Signed)
Worse Claritin 10 mg Depomedrol 80 mg im

## 2015-09-01 ENCOUNTER — Ambulatory Visit: Payer: Managed Care, Other (non HMO)

## 2015-11-21 ENCOUNTER — Ambulatory Visit: Payer: Managed Care, Other (non HMO) | Admitting: Internal Medicine

## 2015-11-28 ENCOUNTER — Encounter: Payer: Self-pay | Admitting: Internal Medicine

## 2015-11-28 ENCOUNTER — Ambulatory Visit (INDEPENDENT_AMBULATORY_CARE_PROVIDER_SITE_OTHER): Payer: Managed Care, Other (non HMO) | Admitting: Internal Medicine

## 2015-11-28 VITALS — BP 118/60 | HR 102 | Wt 131.0 lb

## 2015-11-28 DIAGNOSIS — E538 Deficiency of other specified B group vitamins: Secondary | ICD-10-CM

## 2015-11-28 DIAGNOSIS — J0101 Acute recurrent maxillary sinusitis: Secondary | ICD-10-CM

## 2015-11-28 DIAGNOSIS — E559 Vitamin D deficiency, unspecified: Secondary | ICD-10-CM | POA: Diagnosis not present

## 2015-11-28 MED ORDER — CYANOCOBALAMIN 1000 MCG/ML IJ SOLN: 1000.0000 ug | INTRAMUSCULAR | Status: DC

## 2015-11-28 MED ORDER — VITAMIN D3 1.25 MG (50000 UT) PO CAPS: 1.0000 | ORAL_CAPSULE | ORAL | Status: DC

## 2015-11-28 MED ORDER — "SYRINGE/NEEDLE (DISP) 30G X 1/2"" 1 ML MISC": 1.0000 | Status: DC

## 2015-11-28 MED ORDER — TRIAMCINOLONE ACETONIDE 0.1 % EX CREA
1.0000 | TOPICAL_CREAM | Freq: Four times a day (QID) | CUTANEOUS | Status: DC | PRN
Start: 2015-11-28 — End: 2018-05-21

## 2015-11-28 NOTE — Progress Notes (Signed)
Subjective:  Patient ID: George [name], male    DOB: 14-Oct-1966  Age: 50 y.o. MRN: [id number]  CC: No chief complaint on file.   HPI George [name] presents for sinusitis - on abx, OCD anxiety. Not taking vitamins for ?reson  Outpatient Prescriptions Prior to Visit  Medication Sig Dispense Refill  . Cholecalciferol 1000 UNITS tablet Take 1,000 Units by mouth daily.      . Cyanocobalamin (VITAMIN B-12) 1000 MCG SUBL Place 1 tablet (1,000 mcg total) under the tongue daily. 100 tablet 3  . hydrocortisone (ANUSOL-HC) 2.5 % rectal cream Use bid x 10 d 60 g 1  . loratadine (CLARITIN) 10 MG tablet Take 1 tablet (10 mg total) by mouth daily. 100 tablet 3  . naproxen (NAPROSYN) 500 MG tablet Take 1 tablet (500 mg total) by mouth 2 (two) times daily with a meal. 30 tablet 0  . permethrin (ACTICIN) 5 % cream Apply 1 application topically once. 60 g 1  . sildenafil (VIAGRA) 100 MG tablet Take 1 tablet (100 mg total) by mouth as needed for erectile dysfunction. 12 tablet 6  . triamcinolone cream (KENALOG) 0.1 % Apply 1 application topically 4 (four) times daily as needed. Itching 80 g 2  . azithromycin (ZITHROMAX Z-PAK) 250 MG tablet As directed (Patient not taking: Reported on 11/28/2015) 6 each 0  . ergocalciferol (VITAMIN D2) 50000 UNITS capsule Take 1 capsule (50,000 Units total) by mouth once a week. (Patient not taking: Reported on 11/28/2015) 6 capsule 0   No facility-administered medications prior to visit.    ROS Review of Systems  Constitutional: Negative for appetite change, fatigue and unexpected weight change.  HENT: Negative for congestion, nosebleeds, rhinorrhea, sneezing, sore throat and trouble swallowing.   Eyes: Negative for itching and visual disturbance.  Respiratory: Negative for cough.   Cardiovascular: Negative for chest pain, palpitations and leg swelling.  Gastrointestinal: Negative for nausea, diarrhea, blood in stool and abdominal distention.  Genitourinary:  Negative for frequency and hematuria.  Musculoskeletal: Negative for back pain, joint swelling, gait problem and neck pain.  Skin: Negative for rash.  Neurological: Negative for dizziness, tremors, speech difficulty and weakness.  Psychiatric/Behavioral: Negative for suicidal ideas, sleep disturbance, dysphoric mood and agitation. The patient is nervous/anxious.     Objective:  BP 118/60 mmHg  Pulse 102  Wt 131 lb (59.421 kg)  SpO2 97%  BP Readings from Last 3 Encounters:  11/28/15 118/60  08/09/15 130/80  05/17/15 108/70    Wt Readings from Last 3 Encounters:  11/28/15 131 lb (59.421 kg)  08/09/15 140 lb (63.504 kg)  05/17/15 139 lb (63.05 kg)    Physical Exam  Constitutional: He is oriented to person, place, and time. He appears well-developed. No distress.  NAD  HENT:  Mouth/Throat: Oropharynx is clear and moist.  Eyes: Conjunctivae are normal. Pupils are equal, round, and reactive to light.  Neck: Normal range of motion. No JVD present. No thyromegaly present.  Cardiovascular: Normal rate, regular rhythm, normal heart sounds and intact distal pulses.  Exam reveals no gallop and no friction rub.   No murmur heard. Pulmonary/Chest: Effort normal and breath sounds normal. No respiratory distress. He has no wheezes. He has no rales. He exhibits no tenderness.  Abdominal: Soft. Bowel sounds are normal. He exhibits no distension and no mass. There is no tenderness. There is no rebound and no guarding.  Musculoskeletal: Normal range of motion. He exhibits no edema or tenderness.  Lymphadenopathy:    He has no  cervical adenopathy.  Neurological: He is alert and oriented to person, place, and time. He has normal reflexes. No cranial nerve deficit. He exhibits normal muscle tone. He displays a negative Romberg sign. Coordination and gait normal.  Skin: Skin is warm and dry. No rash noted.  Psychiatric: His behavior is normal. Judgment and thought content normal.  dry skin on B  chest sides  Lab Results  Component Value Date   WBC 4.6 05/17/2015   HGB 13.2 05/17/2015   HCT 40.0 05/17/2015   PLT 251.0 05/17/2015   GLUCOSE 83 05/17/2015   CHOL 170 05/17/2015   TRIG 78.0 05/17/2015   HDL 71.70 05/17/2015   LDLCALC 83 05/17/2015   ALT 20 05/17/2015   AST 20 05/17/2015   NA 139 05/17/2015   K 4.0 05/17/2015   CL 102 05/17/2015   CREATININE 0.81 05/17/2015   BUN 13 05/17/2015   CO2 31 05/17/2015   TSH 1.41 05/17/2015   PSA 0.65 05/19/2015    No results found.  Assessment & Plan:   There are no diagnoses linked to this encounter. I have discontinued George [name] ergocalciferol and azithromycin. I am also having him maintain his Cholecalciferol, Vitamin B-12, hydrocortisone, naproxen, sildenafil, triamcinolone cream, permethrin, loratadine, and cefdinir.  Meds ordered this encounter  Medications  . cefdinir (OMNICEF) 300 MG capsule    Sig: Take 300 mg by mouth 2 (two) times daily.     Follow-up: No Follow-up on file.  Sonda Primes, MD

## 2015-11-28 NOTE — Progress Notes (Signed)
Pre visit review using our clinic review tool, if applicable. No additional management support is needed unless otherwise documented below in the visit note. 

## 2015-11-28 NOTE — Assessment & Plan Note (Signed)
On Abx CT pending

## 2015-11-28 NOTE — Assessment & Plan Note (Addendum)
Risks associated with treatment noncompliance were discussed. Compliance was encouraged. B12 1000 mcg sq q 1 mo

## 2015-11-28 NOTE — Assessment & Plan Note (Signed)
Chronic  Risks associated with treatment noncompliance were discussed. Compliance was encouraged. 1/17 start monthly Rx

## 2015-12-07 ENCOUNTER — Encounter: Payer: Self-pay | Admitting: Internal Medicine

## 2015-12-07 ENCOUNTER — Ambulatory Visit (INDEPENDENT_AMBULATORY_CARE_PROVIDER_SITE_OTHER): Payer: Managed Care, Other (non HMO) | Admitting: Internal Medicine

## 2015-12-07 VITALS — BP 104/60 | HR 85 | Wt 131.0 lb

## 2015-12-07 DIAGNOSIS — E538 Deficiency of other specified B group vitamins: Secondary | ICD-10-CM | POA: Diagnosis not present

## 2015-12-07 DIAGNOSIS — F411 Generalized anxiety disorder: Secondary | ICD-10-CM | POA: Diagnosis not present

## 2015-12-07 DIAGNOSIS — R251 Tremor, unspecified: Secondary | ICD-10-CM | POA: Diagnosis not present

## 2015-12-07 DIAGNOSIS — E559 Vitamin D deficiency, unspecified: Secondary | ICD-10-CM

## 2015-12-07 NOTE — Assessment & Plan Note (Signed)
Re-start B12 

## 2015-12-07 NOTE — Assessment & Plan Note (Signed)
1/17 sporadic - post-work out

## 2015-12-07 NOTE — Assessment & Plan Note (Signed)
Discussed.

## 2015-12-07 NOTE — Progress Notes (Signed)
Subjective:  Patient ID: George [name], male    DOB: 01/28/1966  Age: 50 y.o. MRN: [id number]  CC: No chief complaint on file.   HPI George [name] presents for R hand shaking at times Check nares ?infection Low Vit B12  Outpatient Prescriptions Prior to Visit  Medication Sig Dispense Refill  . cefdinir (OMNICEF) 300 MG capsule Take 300 mg by mouth 2 (two) times daily.    . Cholecalciferol (VITAMIN D3) 50000 units CAPS Take 1 capsule by mouth every 30 (thirty) days. 6 capsule 1  . cyanocobalamin (COBAL-1000) 1000 MCG/ML injection Inject 1 mL (1,000 mcg total) into the skin every 30 (thirty) days. 10 mL 6  . loratadine (CLARITIN) 10 MG tablet Take 1 tablet (10 mg total) by mouth daily. 100 tablet 3  . naproxen (NAPROSYN) 500 MG tablet Take 1 tablet (500 mg total) by mouth 2 (two) times daily with a meal. 30 tablet 0  . sildenafil (VIAGRA) 100 MG tablet Take 1 tablet (100 mg total) by mouth as needed for erectile dysfunction. 12 tablet 6  . triamcinolone cream (KENALOG) 0.1 % Apply 1 application topically 4 (four) times daily as needed. rash 45 g 2  . Syringe/Needle, Disp, (B-D ECLIPSE SYRINGE) 30G X 1/2" 1 ML MISC 1 each by Does not apply route 1 day or 1 dose. 50 each 11   No facility-administered medications prior to visit.    ROS Review of Systems  Constitutional: Negative for appetite change, fatigue and unexpected weight change.  HENT: Negative for congestion, nosebleeds, sneezing, sore throat and trouble swallowing.   Eyes: Negative for itching and visual disturbance.  Respiratory: Negative for cough.   Cardiovascular: Negative for chest pain, palpitations and leg swelling.  Gastrointestinal: Negative for nausea, diarrhea, blood in stool and abdominal distention.  Genitourinary: Negative for frequency and hematuria.  Musculoskeletal: Negative for back pain, joint swelling, gait problem and neck pain.  Skin: Negative for rash.  Neurological: Positive for tremors. Negative  for dizziness, speech difficulty and weakness.  Psychiatric/Behavioral: Negative for sleep disturbance, dysphoric mood and agitation. The patient is not nervous/anxious.     Objective:  BP 104/60 mmHg  Pulse 85  Wt 131 lb (59.421 kg)  SpO2 97%  BP Readings from Last 3 Encounters:  12/07/15 104/60  11/28/15 118/60  08/09/15 130/80    Wt Readings from Last 3 Encounters:  12/07/15 131 lb (59.421 kg)  11/28/15 131 lb (59.421 kg)  08/09/15 140 lb (63.504 kg)    Physical Exam  Constitutional: He is oriented to person, place, and time. He appears well-developed. No distress.  NAD  HENT:  Mouth/Throat: Oropharynx is clear and moist.  Eyes: Conjunctivae are normal. Pupils are equal, round, and reactive to light.  Neck: Normal range of motion. No JVD present. No thyromegaly present.  Cardiovascular: Normal rate, regular rhythm, normal heart sounds and intact distal pulses.  Exam reveals no gallop and no friction rub.   No murmur heard. Pulmonary/Chest: Effort normal and breath sounds normal. No respiratory distress. He has no wheezes. He has no rales. He exhibits no tenderness.  Abdominal: Soft. Bowel sounds are normal. He exhibits no distension and no mass. There is no tenderness. There is no rebound and no guarding.  Musculoskeletal: Normal range of motion. He exhibits no edema or tenderness.  Lymphadenopathy:    He has no cervical adenopathy.  Neurological: He is alert and oriented to person, place, and time. He has normal reflexes. No cranial nerve deficit. He exhibits normal muscle  tone. He displays a negative Romberg sign. Coordination and gait normal.  Skin: Skin is warm and dry. No rash noted.  Psychiatric: He has a normal mood and affect. His behavior is normal. Judgment and thought content normal.  no tremor at rest or intentional tremor   Lab Results  Component Value Date   WBC 4.6 05/17/2015   HGB 13.2 05/17/2015   HCT 40.0 05/17/2015   PLT 251.0 05/17/2015   GLUCOSE  83 05/17/2015   CHOL 170 05/17/2015   TRIG 78.0 05/17/2015   HDL 71.70 05/17/2015   LDLCALC 83 05/17/2015   ALT 20 05/17/2015   AST 20 05/17/2015   NA 139 05/17/2015   K 4.0 05/17/2015   CL 102 05/17/2015   CREATININE 0.81 05/17/2015   BUN 13 05/17/2015   CO2 31 05/17/2015   TSH 1.41 05/17/2015   PSA 0.65 05/19/2015    No results found.  Assessment & Plan:   Diagnoses and all orders for this visit:  B12 deficiency  Vitamin D deficiency  Tremor of right hand  Anxiety state  I have discontinued Mr. Wojcicki Syringe/Needle (Disp). I am also having him maintain his naproxen, sildenafil, loratadine, cefdinir, triamcinolone cream, cyanocobalamin, Vitamin D3, and B-D 3CC LUER-LOK SYR 25GX5/8".  Meds ordered this encounter  Medications  . B-D 3CC LUER-LOK SYR 25GX5/8" 25G X 5/8" 3 ML MISC    Sig: See admin instructions.    Refill:  11     Follow-up: Return in about 6 months (around 06/05/2016) for Wellness Exam.  Sonda Primes, MD

## 2015-12-07 NOTE — Progress Notes (Signed)
Pre visit review using our clinic review tool, if applicable. No additional management support is needed unless otherwise documented below in the visit note. 

## 2015-12-08 ENCOUNTER — Encounter: Payer: Self-pay | Admitting: Internal Medicine

## 2016-04-16 ENCOUNTER — Ambulatory Visit (INDEPENDENT_AMBULATORY_CARE_PROVIDER_SITE_OTHER): Payer: Managed Care, Other (non HMO) | Admitting: Internal Medicine

## 2016-04-16 ENCOUNTER — Other Ambulatory Visit: Payer: Managed Care, Other (non HMO)

## 2016-04-16 ENCOUNTER — Other Ambulatory Visit: Payer: Self-pay | Admitting: Internal Medicine

## 2016-04-16 ENCOUNTER — Encounter: Payer: Self-pay | Admitting: Internal Medicine

## 2016-04-16 VITALS — BP 98/64 | HR 91 | Wt 143.0 lb

## 2016-04-16 DIAGNOSIS — E538 Deficiency of other specified B group vitamins: Secondary | ICD-10-CM | POA: Diagnosis not present

## 2016-04-16 DIAGNOSIS — Z0000 Encounter for general adult medical examination without abnormal findings: Secondary | ICD-10-CM

## 2016-04-16 DIAGNOSIS — L509 Urticaria, unspecified: Secondary | ICD-10-CM

## 2016-04-16 DIAGNOSIS — E559 Vitamin D deficiency, unspecified: Secondary | ICD-10-CM

## 2016-04-16 DIAGNOSIS — R197 Diarrhea, unspecified: Secondary | ICD-10-CM

## 2016-04-16 MED ORDER — VITAMIN D3 50 MCG (2000 UT) PO CAPS: 2000.0000 [IU] | ORAL_CAPSULE | Freq: Every day | ORAL | Status: DC

## 2016-04-16 MED ORDER — VITAMIN B-12 1000 MCG SL SUBL: 1.0000 | SUBLINGUAL_TABLET | Freq: Every day | SUBLINGUAL | Status: DC

## 2016-04-16 MED ORDER — PSEUDOEPHEDRINE HCL 30 MG PO TABS: 60.0000 mg | ORAL_TABLET | ORAL | Status: DC | PRN

## 2016-04-16 MED ORDER — DIPHENHYDRAMINE HCL 25 MG PO TABS: 50.0000 mg | ORAL_TABLET | ORAL | Status: DC | PRN

## 2016-04-16 MED ORDER — PREDNISONE 10 MG PO TABS: ORAL_TABLET | ORAL | Status: DC

## 2016-04-16 NOTE — Assessment & Plan Note (Signed)
On Vit D po 

## 2016-04-16 NOTE — Progress Notes (Signed)
Subjective:  Patient ID: George [name], male    DOB: 04-Jun-1966  Age: 50 y.o. MRN: [id number]  CC: No chief complaint on file.   HPI George [name] presents for Vit D and Vit B12 def. He wants to switch to Vit B12 po C/o dark skin under arms C/o hives after eating oysters x 1 episode - relieved w/Zyrtec D. Pt took pictures - extensive hives...  Outpatient Prescriptions Prior to Visit  Medication Sig Dispense Refill  . B-D 3CC LUER-LOK SYR 25GX5/8" 25G X 5/8" 3 ML MISC See admin instructions.  11  . Cholecalciferol (VITAMIN D3) 50000 units CAPS Take 1 capsule by mouth every 30 (thirty) days. 6 capsule 1  . cyanocobalamin (COBAL-1000) 1000 MCG/ML injection Inject 1 mL (1,000 mcg total) into the skin every 30 (thirty) days. 10 mL 6  . loratadine (CLARITIN) 10 MG tablet Take 1 tablet (10 mg total) by mouth daily. 100 tablet 3  . naproxen (NAPROSYN) 500 MG tablet Take 1 tablet (500 mg total) by mouth 2 (two) times daily with a meal. 30 tablet 0  . sildenafil (VIAGRA) 100 MG tablet Take 1 tablet (100 mg total) by mouth as needed for erectile dysfunction. 12 tablet 6  . triamcinolone cream (KENALOG) 0.1 % Apply 1 application topically 4 (four) times daily as needed. rash 45 g 2  . cefdinir (OMNICEF) 300 MG capsule Take 300 mg by mouth 2 (two) times daily. Reported on 04/16/2016     No facility-administered medications prior to visit.    ROS Review of Systems  Constitutional: Negative for appetite change, fatigue and unexpected weight change.  HENT: Negative for congestion, nosebleeds, sneezing, sore throat and trouble swallowing.   Eyes: Negative for itching and visual disturbance.  Respiratory: Negative for cough.   Cardiovascular: Negative for chest pain, palpitations and leg swelling.  Gastrointestinal: Negative for nausea, diarrhea, blood in stool and abdominal distention.  Genitourinary: Negative for frequency and hematuria.  Musculoskeletal: Negative for back pain, joint swelling,  gait problem and neck pain.  Skin: Negative for rash.  Neurological: Negative for dizziness, tremors, speech difficulty and weakness.  Psychiatric/Behavioral: Negative for sleep disturbance, dysphoric mood and agitation. The patient is not nervous/anxious.     Objective:  BP 98/64 mmHg  Pulse 91  Wt 143 lb (64.864 kg)  SpO2 95%  BP Readings from Last 3 Encounters:  04/16/16 98/64  12/07/15 104/60  11/28/15 118/60    Wt Readings from Last 3 Encounters:  04/16/16 143 lb (64.864 kg)  12/07/15 131 lb (59.421 kg)  11/28/15 131 lb (59.421 kg)    Physical Exam  Constitutional: He is oriented to person, place, and time. He appears well-developed. No distress.  NAD  HENT:  Mouth/Throat: Oropharynx is clear and moist.  Eyes: Conjunctivae are normal. Pupils are equal, round, and reactive to light.  Neck: Normal range of motion. No JVD present. No thyromegaly present.  Cardiovascular: Normal rate, regular rhythm, normal heart sounds and intact distal pulses.  Exam reveals no gallop and no friction rub.   No murmur heard. Pulmonary/Chest: Effort normal and breath sounds normal. No respiratory distress. He has no wheezes. He has no rales. He exhibits no tenderness.  Abdominal: Soft. Bowel sounds are normal. He exhibits no distension and no mass. There is no tenderness. There is no rebound and no guarding.  Musculoskeletal: Normal range of motion. He exhibits no edema or tenderness.  Lymphadenopathy:    He has no cervical adenopathy.  Neurological: He is alert and oriented  to person, place, and time. He has normal reflexes. No cranial nerve deficit. He exhibits normal muscle tone. He displays a negative Romberg sign. Coordination and gait normal.  Skin: Skin is warm and dry. No rash noted.  Psychiatric: He has a normal mood and affect. His behavior is normal. Judgment and thought content normal.    Lab Results  Component Value Date   WBC 4.6 05/17/2015   HGB 13.2 05/17/2015   HCT 40.0  05/17/2015   PLT 251.0 05/17/2015   GLUCOSE 83 05/17/2015   CHOL 170 05/17/2015   TRIG 78.0 05/17/2015   HDL 71.70 05/17/2015   LDLCALC 83 05/17/2015   ALT 20 05/17/2015   AST 20 05/17/2015   NA 139 05/17/2015   K 4.0 05/17/2015   CL 102 05/17/2015   CREATININE 0.81 05/17/2015   BUN 13 05/17/2015   CO2 31 05/17/2015   TSH 1.41 05/17/2015   PSA 0.65 05/19/2015    No results found.  Assessment & Plan:   There are no diagnoses linked to this encounter. I have discontinued Mr. George [name]'s cefdinir. I am also having him maintain his naproxen, sildenafil, loratadine, triamcinolone cream, cyanocobalamin, Vitamin D3, and B-D 3CC LUER-LOK SYR 25GX5/8".  No orders of the defined types were placed in this encounter.     Follow-up: No Follow-up on file.  Sonda PrimesAlex Chanc Kervin, MD

## 2016-04-16 NOTE — Assessment & Plan Note (Signed)
Will switch to SL B12 per pt's request

## 2016-04-16 NOTE — Progress Notes (Signed)
Pre visit review using our clinic review tool, if applicable. No additional management support is needed unless otherwise documented below in the visit note. 

## 2016-04-16 NOTE — Assessment & Plan Note (Signed)
6/17 ?oysters induced Sudafed, Prednisone, Benadryl prn Ziplock bags

## 2016-04-16 NOTE — Assessment & Plan Note (Addendum)
6/17 after Clinda Rx Stool test

## 2016-04-17 LAB — C. DIFFICILE GDH AND TOXIN A/B
C. DIFF TOXIN A/B: NOT DETECTED
C. DIFFICILE GDH: NOT DETECTED

## 2016-04-30 ENCOUNTER — Other Ambulatory Visit (INDEPENDENT_AMBULATORY_CARE_PROVIDER_SITE_OTHER): Payer: Managed Care, Other (non HMO)

## 2016-04-30 DIAGNOSIS — Z0000 Encounter for general adult medical examination without abnormal findings: Secondary | ICD-10-CM | POA: Diagnosis not present

## 2016-04-30 LAB — CBC WITH DIFFERENTIAL/PLATELET
BASOS PCT: 0.8 % (ref 0.0–3.0)
Basophils Absolute: 0 10*3/uL (ref 0.0–0.1)
Eosinophils Absolute: 0.2 10*3/uL (ref 0.0–0.7)
Eosinophils Relative: 5.2 % — ABNORMAL HIGH (ref 0.0–5.0)
HCT: 40.2 % (ref 39.0–52.0)
HEMOGLOBIN: 13.7 g/dL (ref 13.0–17.0)
LYMPHS ABS: 1.4 10*3/uL (ref 0.7–4.0)
Lymphocytes Relative: 40.7 % (ref 12.0–46.0)
MCHC: 34 g/dL (ref 30.0–36.0)
MCV: 92.8 fl (ref 78.0–100.0)
MONO ABS: 0.4 10*3/uL (ref 0.1–1.0)
MONOS PCT: 11.6 % (ref 3.0–12.0)
Neutro Abs: 1.4 10*3/uL (ref 1.4–7.7)
Neutrophils Relative %: 41.7 % — ABNORMAL LOW (ref 43.0–77.0)
Platelets: 226 10*3/uL (ref 150.0–400.0)
RBC: 4.34 Mil/uL (ref 4.22–5.81)
RDW: 12.7 % (ref 11.5–15.5)
WBC: 3.4 10*3/uL — AB (ref 4.0–10.5)

## 2016-04-30 LAB — URINALYSIS
Bilirubin Urine: NEGATIVE
HGB URINE DIPSTICK: NEGATIVE
KETONES UR: NEGATIVE
LEUKOCYTES UA: NEGATIVE
Nitrite: NEGATIVE
SPECIFIC GRAVITY, URINE: 1.02 (ref 1.000–1.030)
Total Protein, Urine: NEGATIVE
URINE GLUCOSE: NEGATIVE
UROBILINOGEN UA: 1 (ref 0.0–1.0)
pH: 6 (ref 5.0–8.0)

## 2016-04-30 LAB — PSA: PSA: 0.51 ng/mL (ref 0.10–4.00)

## 2016-04-30 LAB — HEPATITIS C ANTIBODY: HCV AB: NEGATIVE

## 2016-04-30 LAB — VITAMIN D 25 HYDROXY (VIT D DEFICIENCY, FRACTURES): VITD: 36.53 ng/mL (ref 30.00–100.00)

## 2016-04-30 LAB — BASIC METABOLIC PANEL
BUN: 11 mg/dL (ref 6–23)
CHLORIDE: 102 meq/L (ref 96–112)
CO2: 28 mEq/L (ref 19–32)
Calcium: 9.3 mg/dL (ref 8.4–10.5)
Creatinine, Ser: 0.8 mg/dL (ref 0.40–1.50)
GFR: 130.47 mL/min (ref >60.00)
GLUCOSE: 84 mg/dL (ref 70–99)
POTASSIUM: 3.8 meq/L (ref 3.5–5.1)
SODIUM: 138 meq/L (ref 135–145)

## 2016-04-30 LAB — HEPATIC FUNCTION PANEL
ALBUMIN: 4.2 g/dL (ref 3.5–5.2)
ALK PHOS: 61 U/L (ref 39–117)
ALT: 10 U/L (ref 0–53)
AST: 14 U/L (ref 0–37)
Bilirubin, Direct: 0.4 mg/dL — ABNORMAL HIGH (ref 0.0–0.3)
TOTAL PROTEIN: 7.3 g/dL (ref 6.0–8.3)
Total Bilirubin: 2.6 mg/dL — ABNORMAL HIGH (ref 0.2–1.2)

## 2016-04-30 LAB — VITAMIN B12: Vitamin B-12: 455 pg/mL (ref 211–911)

## 2016-04-30 LAB — TSH: TSH: 1.29 u[IU]/mL (ref 0.35–4.50)

## 2016-05-28 ENCOUNTER — Ambulatory Visit (INDEPENDENT_AMBULATORY_CARE_PROVIDER_SITE_OTHER): Payer: Managed Care, Other (non HMO) | Admitting: Internal Medicine

## 2016-05-28 ENCOUNTER — Encounter: Payer: Self-pay | Admitting: Internal Medicine

## 2016-05-28 VITALS — BP 100/78 | HR 88 | Ht 66.0 in | Wt 142.0 lb

## 2016-05-28 DIAGNOSIS — Z0000 Encounter for general adult medical examination without abnormal findings: Secondary | ICD-10-CM

## 2016-05-28 DIAGNOSIS — E538 Deficiency of other specified B group vitamins: Secondary | ICD-10-CM

## 2016-05-28 DIAGNOSIS — G4700 Insomnia, unspecified: Secondary | ICD-10-CM

## 2016-05-28 DIAGNOSIS — E559 Vitamin D deficiency, unspecified: Secondary | ICD-10-CM | POA: Diagnosis not present

## 2016-05-28 NOTE — Assessment & Plan Note (Signed)
We discussed age appropriate health related issues, including available/recomended screening tests and vaccinations. We discussed a need for adhering to healthy diet and exercise. Labs/EKG were reviewed/ordered. All questions were answered.   

## 2016-05-28 NOTE — Progress Notes (Signed)
Pre visit review using our clinic review tool, if applicable. No additional management support is needed unless otherwise documented below in the visit note. 

## 2016-05-28 NOTE — Assessment & Plan Note (Signed)
No change 

## 2016-05-28 NOTE — Assessment & Plan Note (Signed)
On B12 

## 2016-05-28 NOTE — Assessment & Plan Note (Signed)
On Vit D Rx 

## 2016-05-28 NOTE — Progress Notes (Signed)
Subjective:  Patient ID: George [name], male    DOB: [birth year]  Age: 50 y.o. MRN: [id number]  CC: Annual Exam   HPI George [name] presents for a well exam C/o sinus congestion  Outpatient Prescriptions Prior to Visit  Medication Sig Dispense Refill  . Cholecalciferol (VITAMIN D3) 2000 units capsule Take 1 capsule (2,000 Units total) by mouth daily. 100 capsule 3  . Cyanocobalamin (VITAMIN B-12) 1000 MCG SUBL Place 1 tablet (1,000 mcg total) under the tongue daily. 100 tablet 3  . loratadine (CLARITIN) 10 MG tablet Take 1 tablet (10 mg total) by mouth daily. 100 tablet 3  . sildenafil (VIAGRA) 100 MG tablet Take 1 tablet (100 mg total) by mouth as needed for erectile dysfunction. 12 tablet 6  . triamcinolone cream (KENALOG) 0.1 % Apply 1 application topically 4 (four) times daily as needed. rash 45 g 2  . diphenhydrAMINE (BENADRYL) 25 MG tablet Take 2 tablets (50 mg total) by mouth every 4 (four) hours as needed for allergies (hives). (Patient not taking: Reported on 05/28/2016) 30 tablet 2  . naproxen (NAPROSYN) 500 MG tablet Take 1 tablet (500 mg total) by mouth 2 (two) times daily with a meal. (Patient not taking: Reported on 05/28/2016) 30 tablet 0  . predniSONE (DELTASONE) 10 MG tablet Take 40 mg po qd x 2 days prn hines (Patient not taking: Reported on 05/28/2016) 30 tablet 1  . pseudoephedrine (SUDAFED) 30 MG tablet Take 2 tablets (60 mg total) by mouth every 4 (four) hours as needed (hives). (Patient not taking: Reported on 05/28/2016) 30 tablet 2   No facility-administered medications prior to visit.    ROS Review of Systems  Constitutional: Negative for appetite change, fatigue and unexpected weight change.  HENT: Negative for congestion, nosebleeds, sneezing, sore throat and trouble swallowing.   Eyes: Negative for itching and visual disturbance.  Respiratory: Negative for cough.   Cardiovascular: Negative for chest pain, palpitations and leg swelling.  Gastrointestinal:  Negative for nausea, diarrhea, blood in stool and abdominal distention.  Genitourinary: Negative for frequency and hematuria.  Musculoskeletal: Negative for back pain, joint swelling, gait problem and neck pain.  Skin: Negative for rash.  Neurological: Negative for dizziness, tremors, speech difficulty and weakness.  Psychiatric/Behavioral: Negative for suicidal ideas, sleep disturbance, dysphoric mood and agitation. The patient is nervous/anxious.     Objective:  BP 100/78 mmHg  Pulse 88  Ht  (1.676 m)  Wt 142 lb (64.411 kg)  BMI 22.93 kg/m2  SpO2 96%  BP Readings from Last 3 Encounters:  05/28/16 100/78  04/16/16 98/64  12/07/15 104/60    Wt Readings from Last 3 Encounters:  05/28/16 142 lb (64.411 kg)  04/16/16 143 lb (64.864 kg)  12/07/15 131 lb (59.421 kg)    Physical Exam  Constitutional: He is oriented to person, place, and time. He appears well-developed. No distress.  NAD  HENT:  Mouth/Throat: Oropharynx is clear and moist.  Eyes: Conjunctivae are normal. Pupils are equal, round, and reactive to light.  Neck: Normal range of motion. No JVD present. No thyromegaly present.  Cardiovascular: Normal rate, regular rhythm, normal heart sounds and intact distal pulses.  Exam reveals no gallop and no friction rub.   No murmur heard. Pulmonary/Chest: Effort normal and breath sounds normal. No respiratory distress. He has no wheezes. He has no rales. He exhibits no tenderness.  Abdominal: Soft. Bowel sounds are normal. He exhibits no distension and no mass. There is no tenderness. There is no rebound  and no guarding.  Musculoskeletal: Normal range of motion. He exhibits no edema or tenderness.  Lymphadenopathy:    He has no cervical adenopathy.  Neurological: He is alert and oriented to person, place, and time. He has normal reflexes. No cranial nerve deficit. He exhibits normal muscle tone. He displays a negative Romberg sign. Coordination and gait normal.  Skin: Skin  is warm and dry. No rash noted.  Psychiatric: He has a normal mood and affect. His behavior is normal. Judgment and thought content normal.   Procedure: EKG Indication: abn EKG Impression: NSR. No new changes.  Lab Results  Component Value Date   WBC 3.4* 04/30/2016   HGB 13.7 04/30/2016   HCT 40.2 04/30/2016   PLT 226.0 04/30/2016   GLUCOSE 84 04/30/2016   CHOL 170 05/17/2015   TRIG 78.0 05/17/2015   HDL 71.70 05/17/2015   LDLCALC 83 05/17/2015   ALT 10 04/30/2016   AST 14 04/30/2016   NA 138 04/30/2016   K 3.8 04/30/2016   CL 102 04/30/2016   CREATININE 0.80 04/30/2016   BUN 11 04/30/2016   CO2 28 04/30/2016   TSH 1.29 04/30/2016   PSA 0.51 04/30/2016    No results found.  Assessment & Plan:   There are no diagnoses linked to this encounter. I am having Mr. George [name] maintain his naproxen, sildenafil, loratadine, triamcinolone cream, predniSONE, diphenhydrAMINE, pseudoephedrine, Vitamin D3, and Vitamin B-12.  No orders of the defined types were placed in this encounter.     Follow-up: No Follow-up on file.  Sonda PrimesAlex Plotnikov, MD

## 2016-07-09 ENCOUNTER — Encounter: Payer: Self-pay | Admitting: Internal Medicine

## 2016-07-09 ENCOUNTER — Ambulatory Visit (INDEPENDENT_AMBULATORY_CARE_PROVIDER_SITE_OTHER): Payer: Managed Care, Other (non HMO) | Admitting: Internal Medicine

## 2016-07-09 VITALS — BP 130/90 | HR 89 | Temp 98.6°F | Wt 145.0 lb

## 2016-07-09 DIAGNOSIS — J0101 Acute recurrent maxillary sinusitis: Secondary | ICD-10-CM | POA: Diagnosis not present

## 2016-07-09 DIAGNOSIS — Z23 Encounter for immunization: Secondary | ICD-10-CM | POA: Diagnosis not present

## 2016-07-09 MED ORDER — METHYLPREDNISOLONE ACETATE 80 MG/ML IJ SUSP
80.0000 mg | Freq: Once | INTRAMUSCULAR | Status: AC
  Administered 2016-07-09: 80 mg via INTRAMUSCULAR

## 2016-07-09 NOTE — Progress Notes (Signed)
Pre visit review using our clinic review tool, if applicable. No additional management support is needed unless otherwise documented below in the visit note. 

## 2016-07-09 NOTE — Assessment & Plan Note (Addendum)
Allergic vs viral - no abx Depomedrol IM Humidifier

## 2016-07-09 NOTE — Progress Notes (Signed)
Subjective:  Patient ID: George [name], male    DOB: 02-09-1966  Age: 50 y.o. MRN: [id number]  CC: Headache and Sinusitis (c/o sinus drainage that is casuing nausea)   HPI George DowningRoderick Ballantine presents for sinus sx's - worse; URI sx's  Outpatient Medications Prior to Visit  Medication Sig Dispense Refill  . Cholecalciferol (VITAMIN D3) 2000 units capsule Take 1 capsule (2,000 Units total) by mouth daily. 100 capsule 3  . Cyanocobalamin (VITAMIN B-12) 1000 MCG SUBL Place 1 tablet (1,000 mcg total) under the tongue daily. 100 tablet 3  . diphenhydrAMINE (BENADRYL) 25 MG tablet Take 2 tablets (50 mg total) by mouth every 4 (four) hours as needed for allergies (hives). 30 tablet 2  . loratadine (CLARITIN) 10 MG tablet Take 1 tablet (10 mg total) by mouth daily. 100 tablet 3  . naproxen (NAPROSYN) 500 MG tablet Take 1 tablet (500 mg total) by mouth 2 (two) times daily with a meal. 30 tablet 0  . predniSONE (DELTASONE) 10 MG tablet Take 40 mg po qd x 2 days prn hines 30 tablet 1  . pseudoephedrine (SUDAFED) 30 MG tablet Take 2 tablets (60 mg total) by mouth every 4 (four) hours as needed (hives). 30 tablet 2  . sildenafil (VIAGRA) 100 MG tablet Take 1 tablet (100 mg total) by mouth as needed for erectile dysfunction. 12 tablet 6  . triamcinolone cream (KENALOG) 0.1 % Apply 1 application topically 4 (four) times daily as needed. rash 45 g 2   No facility-administered medications prior to visit.     ROS Review of Systems  Constitutional: Positive for fatigue. Negative for appetite change and unexpected weight change.  HENT: Positive for postnasal drip, rhinorrhea and sinus pressure. Negative for congestion, nosebleeds, sneezing, sore throat and trouble swallowing.   Eyes: Negative for itching and visual disturbance.  Respiratory: Negative for cough.   Cardiovascular: Negative for chest pain, palpitations and leg swelling.  Gastrointestinal: Negative for abdominal distention, blood in stool,  diarrhea and nausea.  Genitourinary: Negative for frequency and hematuria.  Musculoskeletal: Negative for back pain, gait problem, joint swelling and neck pain.  Skin: Negative for rash.  Neurological: Negative for dizziness, tremors, speech difficulty and weakness.  Psychiatric/Behavioral: Negative for agitation, dysphoric mood and sleep disturbance. The patient is not nervous/anxious.     Objective:  BP 130/90   Pulse 89   Temp 98.6 F (37 C) (Oral)   Wt 145 lb (65.8 kg)   SpO2 97%   BMI 23.40 kg/m   BP Readings from Last 3 Encounters:  07/09/16 130/90  05/28/16 100/78  04/16/16 98/64    Wt Readings from Last 3 Encounters:  07/09/16 145 lb (65.8 kg)  05/28/16 142 lb (64.4 kg)  04/16/16 143 lb (64.9 kg)    Physical Exam  Constitutional: He is oriented to person, place, and time. He appears well-developed. No distress.  NAD  HENT:  Mouth/Throat: Oropharynx is clear and moist.  Eyes: Conjunctivae are normal. Pupils are equal, round, and reactive to light.  Neck: Normal range of motion. No JVD present. No thyromegaly present.  Cardiovascular: Normal rate, regular rhythm, normal heart sounds and intact distal pulses.  Exam reveals no gallop and no friction rub.   No murmur heard. Pulmonary/Chest: Effort normal and breath sounds normal. No respiratory distress. He has no wheezes. He has no rales. He exhibits no tenderness.  Abdominal: Soft. Bowel sounds are normal. He exhibits no distension and no mass. There is no tenderness. There is no rebound  and no guarding.  Musculoskeletal: Normal range of motion. He exhibits no edema or tenderness.  Lymphadenopathy:    He has no cervical adenopathy.  Neurological: He is alert and oriented to person, place, and time. He has normal reflexes. No cranial nerve deficit. He exhibits normal muscle tone. He displays a negative Romberg sign. Coordination and gait normal.  Skin: Skin is warm and dry. No rash noted.  Psychiatric: He has a  normal mood and affect. His behavior is normal. Judgment and thought content normal.  eryth nasal mucosa  Lab Results  Component Value Date   WBC 3.4 (L) 04/30/2016   HGB 13.7 04/30/2016   HCT 40.2 04/30/2016   PLT 226.0 04/30/2016   GLUCOSE 84 04/30/2016   CHOL 170 05/17/2015   TRIG 78.0 05/17/2015   HDL 71.70 05/17/2015   LDLCALC 83 05/17/2015   ALT 10 04/30/2016   AST 14 04/30/2016   NA 138 04/30/2016   K 3.8 04/30/2016   CL 102 04/30/2016   CREATININE 0.80 04/30/2016   BUN 11 04/30/2016   CO2 28 04/30/2016   TSH 1.29 04/30/2016   PSA 0.51 04/30/2016    No results found.  Assessment & Plan:   There are no diagnoses linked to this encounter. I am having Mr. Eplin maintain his naproxen, sildenafil, loratadine, triamcinolone cream, predniSONE, diphenhydrAMINE, pseudoephedrine, Vitamin D3, and Vitamin B-12.  No orders of the defined types were placed in this encounter.    Follow-up: No Follow-up on file.  Sonda Primes, MD

## 2016-07-09 NOTE — Addendum Note (Signed)
Addended by: Merrilyn PumaSIMMONS, STACEY N on: 07/09/2016 11:20 AM   Modules accepted: Orders

## 2016-10-29 ENCOUNTER — Telehealth: Payer: Self-pay | Admitting: *Deleted

## 2016-10-29 MED ORDER — VALACYCLOVIR HCL 500 MG PO TABS: 500.0000 mg | ORAL_TABLET | Freq: Two times a day (BID) | ORAL | 0 refills | Status: DC

## 2016-10-29 NOTE — Telephone Encounter (Signed)
OK - done Thx 

## 2016-10-29 NOTE — Telephone Encounter (Signed)
Rec'd call from pt he states he has a cold sore, and wanting to get a rx for valtrex sent to [facility]...Raechel Chute/lmb

## 2016-10-30 NOTE — Telephone Encounter (Signed)
Notified pt MD sent rxto [facility].../lmb 

## 2016-12-03 ENCOUNTER — Ambulatory Visit (INDEPENDENT_AMBULATORY_CARE_PROVIDER_SITE_OTHER): Payer: Managed Care, Other (non HMO) | Admitting: Internal Medicine

## 2016-12-03 ENCOUNTER — Other Ambulatory Visit (INDEPENDENT_AMBULATORY_CARE_PROVIDER_SITE_OTHER): Payer: Managed Care, Other (non HMO)

## 2016-12-03 ENCOUNTER — Encounter: Payer: Self-pay | Admitting: Internal Medicine

## 2016-12-03 DIAGNOSIS — B001 Herpesviral vesicular dermatitis: Secondary | ICD-10-CM

## 2016-12-03 DIAGNOSIS — E538 Deficiency of other specified B group vitamins: Secondary | ICD-10-CM | POA: Diagnosis not present

## 2016-12-03 DIAGNOSIS — E559 Vitamin D deficiency, unspecified: Secondary | ICD-10-CM

## 2016-12-03 LAB — TSH: TSH: 1.53 u[IU]/mL (ref 0.35–4.50)

## 2016-12-03 LAB — HIV ANTIBODY (ROUTINE TESTING W REFLEX): HIV: NONREACTIVE

## 2016-12-03 MED ORDER — PREDNISONE 10 MG PO TABS
ORAL_TABLET | ORAL | 1 refills | Status: DC
Start: 2016-12-03 — End: 2017-03-05

## 2016-12-03 MED ORDER — VALACYCLOVIR HCL 500 MG PO TABS: 500.0000 mg | ORAL_TABLET | Freq: Two times a day (BID) | ORAL | 2 refills | Status: DC

## 2016-12-03 NOTE — Progress Notes (Signed)
Pre visit review using our clinic review tool, if applicable. No additional management support is needed unless otherwise documented below in the visit note. 

## 2016-12-03 NOTE — Assessment & Plan Note (Signed)
Re-start B12 

## 2016-12-03 NOTE — Assessment & Plan Note (Signed)
Valtrex prn Labs

## 2016-12-03 NOTE — Progress Notes (Signed)
Subjective:  Patient ID: George [name], male    DOB: [birth year]  Age: 51 y.o. MRN: [id number]  CC: No chief complaint on file.   HPI George DowningRoderick Denault presents for stress and cold sores C/o wt loss, stress  Outpatient Medications Prior to Visit  Medication Sig Dispense Refill  . Cholecalciferol (VITAMIN D3) 2000 units capsule Take 1 capsule (2,000 Units total) by mouth daily. 100 capsule 3  . Cyanocobalamin (VITAMIN B-12) 1000 MCG SUBL Place 1 tablet (1,000 mcg total) under the tongue daily. 100 tablet 3  . diphenhydrAMINE (BENADRYL) 25 MG tablet Take 2 tablets (50 mg total) by mouth every 4 (four) hours as needed for allergies (hives). 30 tablet 2  . loratadine (CLARITIN) 10 MG tablet Take 1 tablet (10 mg total) by mouth daily. 100 tablet 3  . predniSONE (DELTASONE) 10 MG tablet Take 40 mg po qd x 2 days prn hines 30 tablet 1  . pseudoephedrine (SUDAFED) 30 MG tablet Take 2 tablets (60 mg total) by mouth every 4 (four) hours as needed (hives). 30 tablet 2  . triamcinolone cream (KENALOG) 0.1 % Apply 1 application topically 4 (four) times daily as needed. rash 45 g 2  . valACYclovir (VALTREX) 500 MG tablet Take 1 tablet (500 mg total) by mouth 2 (two) times daily. 10 tablet 0  . naproxen (NAPROSYN) 500 MG tablet Take 1 tablet (500 mg total) by mouth 2 (two) times daily with a meal. (Patient not taking: Reported on 12/03/2016) 30 tablet 0  . sildenafil (VIAGRA) 100 MG tablet Take 1 tablet (100 mg total) by mouth as needed for erectile dysfunction. (Patient not taking: Reported on 12/03/2016) 12 tablet 6   No facility-administered medications prior to visit.     ROS Review of Systems  Constitutional: Negative for appetite change, fatigue and unexpected weight change.  HENT: Negative for congestion, nosebleeds, sneezing, sore throat and trouble swallowing.   Eyes: Negative for itching and visual disturbance.  Respiratory: Negative for cough.   Cardiovascular: Negative for chest pain,  palpitations and leg swelling.  Gastrointestinal: Negative for abdominal distention, blood in stool, diarrhea and nausea.  Genitourinary: Negative for frequency and hematuria.  Musculoskeletal: Negative for back pain, gait problem, joint swelling and neck pain.  Skin: Negative for rash.  Neurological: Negative for dizziness, tremors, speech difficulty and weakness.  Psychiatric/Behavioral: Negative for agitation, dysphoric mood, sleep disturbance and suicidal ideas. The patient is not nervous/anxious.     Objective:  BP 100/72   Pulse (!) 120   Wt 125 lb (56.7 kg)   SpO2 96%   BMI 20.18 kg/m   BP Readings from Last 3 Encounters:  12/03/16 100/72  07/09/16 130/90  05/28/16 100/78    Wt Readings from Last 3 Encounters:  12/03/16 125 lb (56.7 kg)  07/09/16 145 lb (65.8 kg)  05/28/16 142 lb (64.4 kg)    Physical Exam  Constitutional: He is oriented to person, place, and time. He appears well-developed. No distress.  NAD  HENT:  Mouth/Throat: Oropharynx is clear and moist.  Eyes: Conjunctivae are normal. Pupils are equal, round, and reactive to light.  Neck: Normal range of motion. No JVD present. No thyromegaly present.  Cardiovascular: Normal rate, regular rhythm, normal heart sounds and intact distal pulses.  Exam reveals no gallop and no friction rub.   No murmur heard. Pulmonary/Chest: Effort normal and breath sounds normal. No respiratory distress. He has no wheezes. He has no rales. He exhibits no tenderness.  Abdominal: Soft. Bowel sounds are  normal. He exhibits no distension and no mass. There is no tenderness. There is no rebound and no guarding.  Musculoskeletal: Normal range of motion. He exhibits no edema or tenderness.  Lymphadenopathy:    He has no cervical adenopathy.  Neurological: He is alert and oriented to person, place, and time. He has normal reflexes. No cranial nerve deficit. He exhibits normal muscle tone. He displays a negative Romberg sign.  Coordination and gait normal.  Skin: Skin is warm and dry. No rash noted.  Psychiatric: He has a normal mood and affect. His behavior is normal. Judgment and thought content normal.  thin Tachycardic  Lab Results  Component Value Date   WBC 3.4 (L) 04/30/2016   HGB 13.7 04/30/2016   HCT 40.2 04/30/2016   PLT 226.0 04/30/2016   GLUCOSE 84 04/30/2016   CHOL 170 05/17/2015   TRIG 78.0 05/17/2015   HDL 71.70 05/17/2015   LDLCALC 83 05/17/2015   ALT 10 04/30/2016   AST 14 04/30/2016   NA 138 04/30/2016   K 3.8 04/30/2016   CL 102 04/30/2016   CREATININE 0.80 04/30/2016   BUN 11 04/30/2016   CO2 28 04/30/2016   TSH 1.29 04/30/2016   PSA 0.51 04/30/2016    No results found.  Assessment & Plan:   There are no diagnoses linked to this encounter. I am having Mr. Wilmeth maintain his naproxen, sildenafil, loratadine, triamcinolone cream, predniSONE, diphenhydrAMINE, pseudoephedrine, Vitamin D3, Vitamin B-12, and valACYclovir.  No orders of the defined types were placed in this encounter.    Follow-up: No Follow-up on file.  Sonda Primes, MD

## 2016-12-03 NOTE — Assessment & Plan Note (Signed)
Re-strt Vit D

## 2016-12-04 LAB — GC/CHLAMYDIA PROBE AMP
CT Probe RNA: NOT DETECTED
GC Probe RNA: NOT DETECTED

## 2016-12-04 LAB — HSV 2 ANTIBODY, IGG: HSV 2 Glycoprotein G Ab, IgG: <0.9 Index (ref <0.90)

## 2017-01-03 ENCOUNTER — Telehealth: Payer: Self-pay

## 2017-01-03 NOTE — Telephone Encounter (Signed)
Address verified with patient, mailed labs to patient

## 2017-03-05 ENCOUNTER — Encounter: Payer: Self-pay | Admitting: Internal Medicine

## 2017-03-05 ENCOUNTER — Ambulatory Visit (INDEPENDENT_AMBULATORY_CARE_PROVIDER_SITE_OTHER): Payer: Managed Care, Other (non HMO) | Admitting: Internal Medicine

## 2017-03-05 DIAGNOSIS — J301 Allergic rhinitis due to pollen: Secondary | ICD-10-CM | POA: Diagnosis not present

## 2017-03-05 DIAGNOSIS — E538 Deficiency of other specified B group vitamins: Secondary | ICD-10-CM | POA: Diagnosis not present

## 2017-03-05 DIAGNOSIS — B001 Herpesviral vesicular dermatitis: Secondary | ICD-10-CM | POA: Diagnosis not present

## 2017-03-05 MED ORDER — PREDNISONE 10 MG PO TABS: ORAL_TABLET | ORAL | 1 refills | Status: DC

## 2017-03-05 MED ORDER — METHYLPREDNISOLONE ACETATE 80 MG/ML IJ SUSP
80.0000 mg | Freq: Once | INTRAMUSCULAR | Status: AC
  Administered 2017-03-05: 80 mg via INTRAMUSCULAR

## 2017-03-05 MED ORDER — VALACYCLOVIR HCL 500 MG PO TABS: 500.0000 mg | ORAL_TABLET | Freq: Two times a day (BID) | ORAL | 2 refills | Status: DC

## 2017-03-05 NOTE — Assessment & Plan Note (Signed)
Valtrex po 

## 2017-03-05 NOTE — Progress Notes (Signed)
Pre visit review using our clinic review tool, if applicable. No additional management support is needed unless otherwise documented below in the visit note. 

## 2017-03-05 NOTE — Addendum Note (Signed)
Addended by: Scarlett Presto on: 03/05/2017 05:00 PM   Modules accepted: Orders

## 2017-03-05 NOTE — Assessment & Plan Note (Signed)
On B12 

## 2017-03-05 NOTE — Progress Notes (Signed)
Subjective:  Patient ID: George [name], male    DOB: 04/10/1966  Age: 51 y.o. MRN: [id number]  CC: No chief complaint on file.   HPI George [name] presents for ST, allergies, B12 def, recurrence of cold sores f/u  Outpatient Medications Prior to Visit  Medication Sig Dispense Refill  . Cholecalciferol (VITAMIN D3) 2000 units capsule Take 1 capsule (2,000 Units total) by mouth daily. 100 capsule 3  . Cyanocobalamin (VITAMIN B-12) 1000 MCG SUBL Place 1 tablet (1,000 mcg total) under the tongue daily. 100 tablet 3  . diphenhydrAMINE (BENADRYL) 25 MG tablet Take 2 tablets (50 mg total) by mouth every 4 (four) hours as needed for allergies (hives). 30 tablet 2  . loratadine (CLARITIN) 10 MG tablet Take 1 tablet (10 mg total) by mouth daily. 100 tablet 3  . predniSONE (DELTASONE) 10 MG tablet Take 40 mg po qd x 2 days prn hives 30 tablet 1  . pseudoephedrine (SUDAFED) 30 MG tablet Take 2 tablets (60 mg total) by mouth every 4 (four) hours as needed (hives). 30 tablet 2  . triamcinolone cream (KENALOG) 0.1 % Apply 1 application topically 4 (four) times daily as needed. rash 45 g 2  . valACYclovir (VALTREX) 500 MG tablet Take 1 tablet (500 mg total) by mouth 2 (two) times daily. For cold sores prn 14 tablet 2  . naproxen (NAPROSYN) 500 MG tablet Take 1 tablet (500 mg total) by mouth 2 (two) times daily with a meal. (Patient not taking: Reported on 12/03/2016) 30 tablet 0   No facility-administered medications prior to visit.     ROS Review of Systems  Constitutional: Negative for appetite change, fatigue and unexpected weight change.  HENT: Positive for postnasal drip and rhinorrhea. Negative for congestion, nosebleeds, sneezing, sore throat and trouble swallowing.   Eyes: Negative for itching and visual disturbance.  Respiratory: Negative for cough.   Cardiovascular: Negative for chest pain, palpitations and leg swelling.  Gastrointestinal: Negative for abdominal distention, blood in  stool, diarrhea and nausea.  Genitourinary: Negative for frequency and hematuria.  Musculoskeletal: Negative for back pain, gait problem, joint swelling and neck pain.  Skin: Negative for rash.  Neurological: Negative for dizziness, tremors, speech difficulty and weakness.  Psychiatric/Behavioral: Negative for agitation, dysphoric mood and sleep disturbance. The patient is not nervous/anxious.     Objective:  BP 110/68 (BP Location: Right Arm, Patient Position: Sitting, Cuff Size: Normal)   Pulse 80   Temp 98.5 F (36.9 C) (Oral)   Ht  (1.676 m)   Wt 126 lb (57.2 kg)   SpO2 98%   BMI 20.34 kg/m   BP Readings from Last 3 Encounters:  03/05/17 110/68  12/03/16 100/72  07/09/16 130/90    Wt Readings from Last 3 Encounters:  03/05/17 126 lb (57.2 kg)  12/03/16 125 lb (56.7 kg)  07/09/16 145 lb (65.8 kg)    Physical Exam  Constitutional: He is oriented to person, place, and time. He appears well-developed. No distress.  NAD  HENT:  Mouth/Throat: Oropharynx is clear and moist.  Eyes: Conjunctivae are normal. Pupils are equal, round, and reactive to light.  Neck: Normal range of motion. No JVD present. No thyromegaly present.  Cardiovascular: Normal rate, regular rhythm, normal heart sounds and intact distal pulses.  Exam reveals no gallop and no friction rub.   No murmur heard. Pulmonary/Chest: Effort normal and breath sounds normal. No respiratory distress. He has no wheezes. He has no rales. He exhibits no tenderness.  Abdominal:  Soft. Bowel sounds are normal. He exhibits no distension and no mass. There is no tenderness. There is no rebound and no guarding.  Musculoskeletal: Normal range of motion. He exhibits no edema or tenderness.  Lymphadenopathy:    He has no cervical adenopathy.  Neurological: He is alert and oriented to person, place, and time. He has normal reflexes. No cranial nerve deficit. He exhibits normal muscle tone. He displays a negative Romberg sign.  Coordination and gait normal.  Skin: Skin is warm and dry. No rash noted.  Psychiatric: He has a normal mood and affect. His behavior is normal. Judgment and thought content normal.    Lab Results  Component Value Date   WBC 3.4 (L) 04/30/2016   HGB 13.7 04/30/2016   HCT 40.2 04/30/2016   PLT 226.0 04/30/2016   GLUCOSE 84 04/30/2016   CHOL 170 05/17/2015   TRIG 78.0 05/17/2015   HDL 71.70 05/17/2015   LDLCALC 83 05/17/2015   ALT 10 04/30/2016   AST 14 04/30/2016   NA 138 04/30/2016   K 3.8 04/30/2016   CL 102 04/30/2016   CREATININE 0.80 04/30/2016   BUN 11 04/30/2016   CO2 28 04/30/2016   TSH 1.53 12/03/2016   PSA 0.51 04/30/2016    No results found.  Assessment & Plan:   There are no diagnoses linked to this encounter. I have discontinued Mr. Sheetz naproxen. I am also having him maintain his loratadine, triamcinolone cream, diphenhydrAMINE, pseudoephedrine, Vitamin D3, Vitamin B-12, valACYclovir, and predniSONE.  No orders of the defined types were placed in this encounter.    Follow-up: No Follow-up on file.  Sonda Primes, MD

## 2017-03-05 NOTE — Assessment & Plan Note (Signed)
Depo-medrol 80 mg IM 

## 2017-07-19 ENCOUNTER — Ambulatory Visit (INDEPENDENT_AMBULATORY_CARE_PROVIDER_SITE_OTHER): Payer: Managed Care, Other (non HMO) | Admitting: Internal Medicine

## 2017-07-19 ENCOUNTER — Encounter: Payer: Self-pay | Admitting: Internal Medicine

## 2017-07-19 VITALS — BP 112/32 | HR 74 | Temp 98.8°F | Ht 66.0 in | Wt 133.0 lb

## 2017-07-19 DIAGNOSIS — N528 Other male erectile dysfunction: Secondary | ICD-10-CM

## 2017-07-19 DIAGNOSIS — L84 Corns and callosities: Secondary | ICD-10-CM | POA: Diagnosis not present

## 2017-07-19 DIAGNOSIS — E538 Deficiency of other specified B group vitamins: Secondary | ICD-10-CM | POA: Diagnosis not present

## 2017-07-19 DIAGNOSIS — Z23 Encounter for immunization: Secondary | ICD-10-CM

## 2017-07-19 MED ORDER — SILDENAFIL CITRATE 100 MG PO TABS: 100.0000 mg | ORAL_TABLET | ORAL | 5 refills | Status: DC | PRN

## 2017-07-19 MED ORDER — VALACYCLOVIR HCL 500 MG PO TABS: 500.0000 mg | ORAL_TABLET | Freq: Two times a day (BID) | ORAL | 2 refills | Status: DC

## 2017-07-19 NOTE — Progress Notes (Signed)
Subjective:  Patient ID: George [name], male    DOB: [birth year]  Age: 51 y.o. MRN: [id number]  CC: No chief complaint on file.   HPI George [name] presents for ED, calluces, anxiety f/u  Outpatient Medications Prior to Visit  Medication Sig Dispense Refill  . Cholecalciferol (VITAMIN D3) 2000 units capsule Take 1 capsule (2,000 Units total) by mouth daily. 100 capsule 3  . Cyanocobalamin (VITAMIN B-12) 1000 MCG SUBL Place 1 tablet (1,000 mcg total) under the tongue daily. 100 tablet 3  . diphenhydrAMINE (BENADRYL) 25 MG tablet Take 2 tablets (50 mg total) by mouth every 4 (four) hours as needed for allergies (hives). 30 tablet 2  . loratadine (CLARITIN) 10 MG tablet Take 1 tablet (10 mg total) by mouth daily. 100 tablet 3  . predniSONE (DELTASONE) 10 MG tablet Take 40 mg po qd x 2 days prn hives 30 tablet 1  . pseudoephedrine (SUDAFED) 30 MG tablet Take 2 tablets (60 mg total) by mouth every 4 (four) hours as needed (hives). 30 tablet 2  . triamcinolone cream (KENALOG) 0.1 % Apply 1 application topically 4 (four) times daily as needed. rash 45 g 2  . valACYclovir (VALTREX) 500 MG tablet Take 1 tablet (500 mg total) by mouth 2 (two) times daily. For cold sores prn 14 tablet 2   No facility-administered medications prior to visit.     ROS Review of Systems  Constitutional: Negative for appetite change, fatigue and unexpected weight change.  HENT: Negative for congestion, nosebleeds, sneezing, sore throat and trouble swallowing.   Eyes: Negative for itching and visual disturbance.  Respiratory: Negative for cough.   Cardiovascular: Negative for chest pain, palpitations and leg swelling.  Gastrointestinal: Negative for abdominal distention, blood in stool, diarrhea and nausea.  Genitourinary: Negative for frequency and hematuria.  Musculoskeletal: Negative for back pain, gait problem, joint swelling and neck pain.  Skin: Negative for rash.  Neurological: Negative for dizziness,  tremors, speech difficulty and weakness.  Psychiatric/Behavioral: Negative for agitation, dysphoric mood and sleep disturbance. The patient is nervous/anxious.     Objective:  BP (!) 112/32 (BP Location: Left Arm, Patient Position: Sitting, Cuff Size: Large)   Pulse 74   Temp 98.8 F (37.1 C) (Oral)   Ht  (1.676 m)   Wt 133 lb (60.3 kg)   SpO2 99%   BMI 21.47 kg/m   BP Readings from Last 3 Encounters:  07/19/17 (!) 112/32  03/05/17 110/68  12/03/16 100/72    Wt Readings from Last 3 Encounters:  07/19/17 133 lb (60.3 kg)  03/05/17 126 lb (57.2 kg)  12/03/16 125 lb (56.7 kg)    Physical Exam  Constitutional: He is oriented to person, place, and time. He appears well-developed. No distress.  NAD  HENT:  Mouth/Throat: Oropharynx is clear and moist.  Eyes: Pupils are equal, round, and reactive to light. Conjunctivae are normal.  Neck: Normal range of motion. No JVD present. No thyromegaly present.  Cardiovascular: Normal rate, regular rhythm, normal heart sounds and intact distal pulses.  Exam reveals no gallop and no friction rub.   No murmur heard. Pulmonary/Chest: Effort normal and breath sounds normal. No respiratory distress. He has no wheezes. He has no rales. He exhibits no tenderness.  Abdominal: Soft. Bowel sounds are normal. He exhibits no distension and no mass. There is no tenderness. There is no rebound and no guarding.  Musculoskeletal: Normal range of motion. He exhibits no edema or tenderness.  Lymphadenopathy:    He  has no cervical adenopathy.  Neurological: He is alert and oriented to person, place, and time. He has normal reflexes. No cranial nerve deficit. He exhibits normal muscle tone. He displays a negative Romberg sign. Coordination and gait normal.  Skin: Skin is warm and dry. No rash noted.  Psychiatric: He has a normal mood and affect. His behavior is normal. Judgment and thought content normal.  calluces on feet  Lab Results  Component Value  Date   WBC 3.4 (L) 04/30/2016   HGB 13.7 04/30/2016   HCT 40.2 04/30/2016   PLT 226.0 04/30/2016   GLUCOSE 84 04/30/2016   CHOL 170 05/17/2015   TRIG 78.0 05/17/2015   HDL 71.70 05/17/2015   LDLCALC 83 05/17/2015   ALT 10 04/30/2016   AST 14 04/30/2016   NA 138 04/30/2016   K 3.8 04/30/2016   CL 102 04/30/2016   CREATININE 0.80 04/30/2016   BUN 11 04/30/2016   CO2 28 04/30/2016   TSH 1.53 12/03/2016   PSA 0.51 04/30/2016    No results found.  Assessment & Plan:   There are no diagnoses linked to this encounter. I am having George [name] maintain his loratadine, triamcinolone cream, diphenhydrAMINE, pseudoephedrine, Vitamin D3, Vitamin B-12, predniSONE, and valACYclovir.  Meds ordered this encounter  Medications  . valACYclovir (VALTREX) 500 MG tablet    Sig: Take 1 tablet (500 mg total) by mouth 2 (two) times daily. For cold sores prn    Dispense:  14 tablet    Refill:  2     Follow-up: No Follow-up on file.  Sonda PrimesAlex Annasophia Crocker, MD

## 2017-07-19 NOTE — Assessment & Plan Note (Signed)
On B12 

## 2017-07-19 NOTE — Assessment & Plan Note (Signed)
Try GLYTONE exfoliating body lotion by Pierre Fabre (free acid value 17.5)  

## 2017-07-19 NOTE — Patient Instructions (Addendum)
Try GLYTONE exfoliating body lotion by Evalyn CascoPierre Fabre (free acid value 17.5)  Use [employer] Peroxicare tooth paste

## 2017-07-19 NOTE — Assessment & Plan Note (Signed)
Viagra prn 

## 2017-07-19 NOTE — Addendum Note (Signed)
Addended by: Scarlett PrestoFRIEDENBACH, Kayle Correa on: 07/19/2017 12:00 PM   Modules accepted: Orders

## 2017-09-03 ENCOUNTER — Ambulatory Visit: Payer: Managed Care, Other (non HMO) | Admitting: Internal Medicine

## 2018-01-10 ENCOUNTER — Encounter: Payer: Managed Care, Other (non HMO) | Admitting: Internal Medicine

## 2018-05-21 ENCOUNTER — Other Ambulatory Visit (INDEPENDENT_AMBULATORY_CARE_PROVIDER_SITE_OTHER): Payer: Commercial Managed Care - PPO

## 2018-05-21 ENCOUNTER — Ambulatory Visit: Payer: Commercial Managed Care - PPO | Admitting: Internal Medicine

## 2018-05-21 ENCOUNTER — Ambulatory Visit: Payer: Self-pay | Admitting: Internal Medicine

## 2018-05-21 ENCOUNTER — Encounter: Payer: Self-pay | Admitting: Internal Medicine

## 2018-05-21 DIAGNOSIS — K1379 Other lesions of oral mucosa: Secondary | ICD-10-CM | POA: Diagnosis not present

## 2018-05-21 DIAGNOSIS — E538 Deficiency of other specified B group vitamins: Secondary | ICD-10-CM | POA: Diagnosis not present

## 2018-05-21 DIAGNOSIS — E559 Vitamin D deficiency, unspecified: Secondary | ICD-10-CM

## 2018-05-21 DIAGNOSIS — I10 Essential (primary) hypertension: Secondary | ICD-10-CM

## 2018-05-21 DIAGNOSIS — N528 Other male erectile dysfunction: Secondary | ICD-10-CM

## 2018-05-21 DIAGNOSIS — K130 Diseases of lips: Secondary | ICD-10-CM | POA: Insufficient documentation

## 2018-05-21 DIAGNOSIS — B001 Herpesviral vesicular dermatitis: Secondary | ICD-10-CM | POA: Diagnosis not present

## 2018-05-21 DIAGNOSIS — R739 Hyperglycemia, unspecified: Secondary | ICD-10-CM | POA: Diagnosis not present

## 2018-05-21 LAB — VITAMIN B12: Vitamin B-12: 274 pg/mL (ref 211–911)

## 2018-05-21 LAB — CBC WITH DIFFERENTIAL/PLATELET
BASOS ABS: 0.1 10*3/uL (ref 0.0–0.1)
Basophils Relative: 1.2 % (ref 0.0–3.0)
Eosinophils Absolute: 0.2 10*3/uL (ref 0.0–0.7)
Eosinophils Relative: 4.3 % (ref 0.0–5.0)
HCT: 39.7 % (ref 39.0–52.0)
Hemoglobin: 13.3 g/dL (ref 13.0–17.0)
LYMPHS ABS: 1.7 10*3/uL (ref 0.7–4.0)
Lymphocytes Relative: 38.6 % (ref 12.0–46.0)
MCHC: 33.5 g/dL (ref 30.0–36.0)
MCV: 94.5 fl (ref 78.0–100.0)
MONO ABS: 0.5 10*3/uL (ref 0.1–1.0)
MONOS PCT: 11.5 % (ref 3.0–12.0)
NEUTROS PCT: 44.4 % (ref 43.0–77.0)
Neutro Abs: 2 10*3/uL (ref 1.4–7.7)
Platelets: 232 10*3/uL (ref 150.0–400.0)
RBC: 4.2 Mil/uL — AB (ref 4.22–5.81)
RDW: 12.7 % (ref 11.5–15.5)
WBC: 4.5 10*3/uL (ref 4.0–10.5)

## 2018-05-21 LAB — HEMOGLOBIN A1C: Hgb A1c MFr Bld: 4.7 % (ref 4.6–6.5)

## 2018-05-21 LAB — VITAMIN D 25 HYDROXY (VIT D DEFICIENCY, FRACTURES): VITD: 21.86 ng/mL — AB (ref 30.00–100.00)

## 2018-05-21 LAB — TSH: TSH: 1.48 u[IU]/mL (ref 0.35–4.50)

## 2018-05-21 MED ORDER — VALACYCLOVIR HCL 500 MG PO TABS: 500.0000 mg | ORAL_TABLET | Freq: Two times a day (BID) | ORAL | 2 refills | Status: DC

## 2018-05-21 MED ORDER — SILDENAFIL CITRATE 100 MG PO TABS: 100.0000 mg | ORAL_TABLET | ORAL | 5 refills | Status: DC | PRN

## 2018-05-21 MED ORDER — TRIAMCINOLONE ACETONIDE 0.5 % EX CREA: 1.0000 "application " | TOPICAL_CREAM | Freq: Two times a day (BID) | CUTANEOUS | 2 refills | Status: DC

## 2018-05-21 NOTE — Assessment & Plan Note (Signed)
Viagra prn 

## 2018-05-21 NOTE — Assessment & Plan Note (Signed)
Valtrex prn  

## 2018-05-21 NOTE — Patient Instructions (Addendum)
Use [employer] Peroxicare tooth paste    Mucocele of the Mouth A mucocele is a growth or bump (cyst) that is filled with mucus. A mucocele can form on various parts of the mouth, including the gums, the tongue, and the inside of the cheeks. A common spot is inside the lower lip. Mucoceles are not dangerous, and they are usually not painful. A mucocele can be uncomfortable if it is very large or if it is located under the tongue. Small mucoceles often clear up on their own. Treatment may not be needed. Mucoceles that are bigger or that keep coming back might need to be removed. What are the causes? Mucoceles form when the salivary ducts in the mouth are damaged and leak saliva. These ducts carry saliva from the salivary glands to the surface of the mouth. A mucocele can develop because of:  An injury to your mouth.  Sucking or biting on your lips or tongue.  A blocked salivary duct. This is sometimes caused by swelling.  Piercing of the tongue or lip for jewelry.  In some cases, the cause may not be known. What are the signs or symptoms? Symptoms of this condition include a smooth, painless bumpinside your mouth. The bump may:  Show up all of a sudden.  Have thin walls and a bluish color.  Change in size. Most are smaller than  inch.  Mucoceles under the tongue are called ranulas. These may be bigger and may push your tongue up and back. In some cases, this can make it hard to talk, swallow, or breathe. How is this diagnosed? This condition is usually diagnosed with a physical exam. Your health care provider will often be able to tell if you have a mucocele by looking at it and feeling it. You may also have tests, such as:  An ultrasound to check for problems with your salivary gland.  An X-ray to see if stones are blocking the exit of saliva.  How is this treated? Treatment may depend on the size of the mucocele:  For a small mucocele, treatment is usually not needed. It will  drain on its own and go away.  For larger mucoceles and ranulas, surgery may be needed. This may be done if the mucocele does not go away or if it keeps coming back. The entire mucocele may be taken out. In some cases, the salivary gland may also be removed.  Follow these instructions at home:  Take over-the-counter and prescription medicines only as told by your health care provider.  Do not try to drain a mucocele on your own. Do not poke a hole in it.  Do not use any tobacco products, such as cigarettes, chewing tobacco, and e-cigarettes. If you need help quitting, ask your health care provider.  Do not suck or bite on your lips or tongue.  If your mucocele was removed, avoid hard or spicy foods and foods that have high acidity while your mouth is healing.  Keep all follow-up visits as told by your health care provider. This is important. Contact a health care provider if:  You have a lump or cyst inside your mouth that does not go away.  You have a fever. Get help right away if:  You have a lump or cyst in your mouth that: ? Becomes painful. ? Gets large very fast.  You have a lump or cyst in your mouth that makes it hard to: ? Swallow. ? Talk. ? Breathe. This information is not intended  to replace advice given to you by your health care provider. Make sure you discuss any questions you have with your health care provider. Document Released: 11/25/2015 Document Revised: 04/05/2016 Document Reviewed: 01/24/2015 Elsevier Interactive Patient Education  [employer]2018 [employer]

## 2018-05-21 NOTE — Assessment & Plan Note (Signed)
B 12

## 2018-05-21 NOTE — Assessment & Plan Note (Signed)
ENT ref offered 

## 2018-05-21 NOTE — Assessment & Plan Note (Signed)
Vit d 

## 2018-05-21 NOTE — Progress Notes (Signed)
Subjective:  Patient ID: George [name], male    DOB: 1966-08-21  Age: 52 y.o. MRN: [id number]  CC: No chief complaint on file.   HPI George [name] presents for cyst, low B12, ED f/u  Outpatient Medications Prior to Visit  Medication Sig Dispense Refill  . Cholecalciferol (VITAMIN D3) 2000 units capsule Take 1 capsule (2,000 Units total) by mouth daily. 100 capsule 3  . Cyanocobalamin (VITAMIN B-12) 1000 MCG SUBL Place 1 tablet (1,000 mcg total) under the tongue daily. 100 tablet 3  . diphenhydrAMINE (BENADRYL) 25 MG tablet Take 2 tablets (50 mg total) by mouth every 4 (four) hours as needed for allergies (hives). 30 tablet 2  . loratadine (CLARITIN) 10 MG tablet Take 1 tablet (10 mg total) by mouth daily. 100 tablet 3  . predniSONE (DELTASONE) 10 MG tablet Take 40 mg po qd x 2 days prn hives 30 tablet 1  . pseudoephedrine (SUDAFED) 30 MG tablet Take 2 tablets (60 mg total) by mouth every 4 (four) hours as needed (hives). 30 tablet 2  . triamcinolone cream (KENALOG) 0.1 % Apply 1 application topically 4 (four) times daily as needed. rash 45 g 2  . sildenafil (VIAGRA) 100 MG tablet Take 1 tablet (100 mg total) by mouth as needed for erectile dysfunction. 30 tablet 5  . valACYclovir (VALTREX) 500 MG tablet Take 1 tablet (500 mg total) by mouth 2 (two) times daily. For cold sores prn 14 tablet 2   No facility-administered medications prior to visit.     ROS: Review of Systems  Constitutional: Negative for appetite change, fatigue and unexpected weight change.  HENT: Negative for congestion, nosebleeds, sneezing, sore throat and trouble swallowing.   Eyes: Negative for itching and visual disturbance.  Respiratory: Negative for cough.   Cardiovascular: Negative for chest pain, palpitations and leg swelling.  Gastrointestinal: Negative for abdominal distention, blood in stool, diarrhea and nausea.  Genitourinary: Negative for frequency and hematuria.  Musculoskeletal: Negative for  back pain, gait problem, joint swelling and neck pain.  Skin: Negative for rash.  Neurological: Negative for dizziness, tremors, speech difficulty and weakness.  Psychiatric/Behavioral: Negative for agitation, dysphoric mood, sleep disturbance and suicidal ideas. The patient is not nervous/anxious.     Objective:  BP 114/70 (BP Location: Left Arm, Patient Position: Sitting, Cuff Size: Normal)   Pulse 77   Temp 98 F (36.7 C) (Oral)   Ht 5\' 6"  (1.676 m)   Wt 133 lb (60.3 kg)   SpO2 98%   BMI 21.47 kg/m   BP Readings from Last 3 Encounters:  05/21/18 114/70  07/19/17 (!) 112/32  03/05/17 110/68    Wt Readings from Last 3 Encounters:  05/21/18 133 lb (60.3 kg)  07/19/17 133 lb (60.3 kg)  03/05/17 126 lb (57.2 kg)    Physical Exam  Constitutional: He is oriented to person, place, and time. He appears well-developed. No distress.  NAD  HENT:  Mouth/Throat: Oropharynx is clear and moist.  Eyes: Pupils are equal, round, and reactive to light. Conjunctivae are normal.  Neck: Normal range of motion. No JVD present. No thyromegaly present.  Cardiovascular: Normal rate, regular rhythm, normal heart sounds and intact distal pulses. Exam reveals no gallop and no friction rub.  No murmur heard. Pulmonary/Chest: Effort normal and breath sounds normal. No respiratory distress. He has no wheezes. He has no rales. He exhibits no tenderness.  Abdominal: Soft. Bowel sounds are normal. He exhibits no distension and no mass. There is no tenderness. There  is no rebound and no guarding.  Musculoskeletal: Normal range of motion. He exhibits no edema or tenderness.  Lymphadenopathy:    He has no cervical adenopathy.  Neurological: He is alert and oriented to person, place, and time. He has normal reflexes. No cranial nerve deficit. He exhibits normal muscle tone. He displays a negative Romberg sign. Coordination and gait normal.  Skin: Skin is warm and dry. No rash noted.  Psychiatric: He has a  normal mood and affect. His behavior is normal. Judgment and thought content normal.   Mucocele lower lip 5 mm  Lab Results  Component Value Date   WBC 3.4 (L) 04/30/2016   HGB 13.7 04/30/2016   HCT 40.2 04/30/2016   PLT 226.0 04/30/2016   GLUCOSE 84 04/30/2016   CHOL 170 05/17/2015   TRIG 78.0 05/17/2015   HDL 71.70 05/17/2015   LDLCALC 83 05/17/2015   ALT 10 04/30/2016   AST 14 04/30/2016   NA 138 04/30/2016   K 3.8 04/30/2016   CL 102 04/30/2016   CREATININE 0.80 04/30/2016   BUN 11 04/30/2016   CO2 28 04/30/2016   TSH 1.53 12/03/2016   PSA 0.51 04/30/2016    No results found.  Assessment & Plan:   There are no diagnoses linked to this encounter.   Meds ordered this encounter  Medications  . valACYclovir (VALTREX) 500 MG tablet    Sig: Take 1 tablet (500 mg total) by mouth 2 (two) times daily. For cold sores prn    Dispense:  28 tablet    Refill:  2  . DISCONTD: sildenafil (VIAGRA) 100 MG tablet    Sig: Take 1 tablet (100 mg total) by mouth as needed for erectile dysfunction.    Dispense:  10 tablet    Refill:  5  . sildenafil (VIAGRA) 100 MG tablet    Sig: Take 1 tablet (100 mg total) by mouth as needed for erectile dysfunction.    Dispense:  30 tablet    Refill:  5     Follow-up: No follow-ups on file.  Sonda Primes, MD

## 2018-05-22 ENCOUNTER — Other Ambulatory Visit: Payer: Self-pay | Admitting: Internal Medicine

## 2018-05-22 LAB — BASIC METABOLIC PANEL
BUN: 12 mg/dL (ref 6–23)
CALCIUM: 9 mg/dL (ref 8.4–10.5)
CO2: 30 mEq/L (ref 19–32)
Chloride: 102 mEq/L (ref 96–112)
Creatinine, Ser: 0.87 mg/dL (ref 0.40–1.50)
GFR: 117.51 mL/min (ref >60.00)
GLUCOSE: 75 mg/dL (ref 70–99)
Potassium: 3.8 mEq/L (ref 3.5–5.1)
SODIUM: 138 meq/L (ref 135–145)

## 2018-05-22 MED ORDER — VITAMIN D3 1.25 MG (50000 UT) PO CAPS: 1.0000 | ORAL_CAPSULE | ORAL | 0 refills | Status: DC

## 2018-06-11 ENCOUNTER — Other Ambulatory Visit: Payer: Self-pay | Admitting: Internal Medicine

## 2018-06-17 ENCOUNTER — Other Ambulatory Visit: Payer: Self-pay | Admitting: Internal Medicine

## 2018-07-25 DIAGNOSIS — H25812 Combined forms of age-related cataract, left eye: Secondary | ICD-10-CM | POA: Diagnosis not present

## 2018-07-26 ENCOUNTER — Other Ambulatory Visit: Payer: Self-pay | Admitting: Internal Medicine

## 2018-09-26 DIAGNOSIS — H5789 Other specified disorders of eye and adnexa: Secondary | ICD-10-CM | POA: Diagnosis not present

## 2018-09-26 DIAGNOSIS — H25812 Combined forms of age-related cataract, left eye: Secondary | ICD-10-CM | POA: Diagnosis not present

## 2018-09-26 DIAGNOSIS — H2512 Age-related nuclear cataract, left eye: Secondary | ICD-10-CM | POA: Diagnosis not present

## 2018-11-23 ENCOUNTER — Other Ambulatory Visit: Payer: Self-pay | Admitting: Internal Medicine

## 2019-01-06 ENCOUNTER — Ambulatory Visit: Payer: Commercial Managed Care - PPO | Admitting: Internal Medicine

## 2019-01-06 ENCOUNTER — Encounter: Payer: Self-pay | Admitting: Internal Medicine

## 2019-01-06 DIAGNOSIS — H6123 Impacted cerumen, bilateral: Secondary | ICD-10-CM

## 2019-01-06 NOTE — Patient Instructions (Signed)
You can use over-the-counter  "cold" medicines  such as "Tylenol cold" , "Advil cold",  "Mucinex" or" Mucinex D"  for cough and congestion.   Use "Afrin" nasal spray for nasal congestion as directed. Use " Delsym" or" Robitussin" cough syrup varietis for cough.  You can use plain "Tylenol" or "Advil" for fever, chills and achyness. Use Halls or Ricola cough drops.   "Common cold" symptoms are usually triggered by a virus.  The antibiotics are usually not necessary. On average, a" viral cold" illness would take 7 days to resolve.   Please, make an appointment if you are not better or if you're worse.

## 2019-01-06 NOTE — Assessment & Plan Note (Signed)
See procedure 

## 2019-01-06 NOTE — Progress Notes (Signed)
Subjective:  Patient ID: George [name], male    DOB: 1966/07/12  Age: 53 y.o. MRN: [id number]  CC: No chief complaint on file.   HPI Melba Visaya presents for URI sx's x x 1 wk - week, ST, cough, fever  Outpatient Medications Prior to Visit  Medication Sig Dispense Refill  . Cholecalciferol (VITAMIN D3) 2000 units capsule Take 1 capsule (2,000 Units total) by mouth daily. 100 capsule 3  . Cholecalciferol (VITAMIN D3) 50000 units CAPS Take 1 capsule by mouth once a week. 6 capsule 0  . Cyanocobalamin (VITAMIN B-12) 1000 MCG SUBL Place 1 tablet (1,000 mcg total) under the tongue daily. 100 tablet 3  . diphenhydrAMINE (BENADRYL) 25 MG tablet Take 2 tablets (50 mg total) by mouth every 4 (four) hours as needed for allergies (hives). 30 tablet 2  . loratadine (CLARITIN) 10 MG tablet Take 1 tablet (10 mg total) by mouth daily. 100 tablet 3  . predniSONE (DELTASONE) 10 MG tablet Take 40 mg po qd x 2 days prn hives 30 tablet 1  . pseudoephedrine (SUDAFED) 30 MG tablet Take 2 tablets (60 mg total) by mouth every 4 (four) hours as needed (hives). 30 tablet 2  . sildenafil (VIAGRA) 100 MG tablet Take 1 tablet (100 mg total) by mouth as needed for erectile dysfunction. 30 tablet 5  . triamcinolone cream (KENALOG) 0.5 % Apply to affective shin area twice a day as neded 30 g 0  . valACYclovir (VALTREX) 500 MG tablet TAKE 1 TABLET (500 MG TOTAL) BY MOUTH 2 (TWO) TIMES DAILY FOR COLD SORES AS NEEDED 28 tablet 2   No facility-administered medications prior to visit.     ROS: Review of Systems  Constitutional: Positive for chills and fatigue.  HENT: Positive for congestion, postnasal drip, rhinorrhea, sinus pressure and sore throat.   Respiratory: Positive for cough.   Musculoskeletal: Positive for arthralgias.  Neurological: Positive for weakness.    Objective:  BP 120/74 (BP Location: Left Arm, Patient Position: Sitting, Cuff Size: Normal)   Pulse 75   Temp 98.5 F (36.9 C) (Oral)   Ht  5\' 6"  (1.676 m)   Wt 138 lb (62.6 kg)   SpO2 96%   BMI 22.27 kg/m   BP Readings from Last 3 Encounters:  01/06/19 120/74  05/21/18 114/70  07/19/17 (!) 112/32    Wt Readings from Last 3 Encounters:  01/06/19 138 lb (62.6 kg)  05/21/18 133 lb (60.3 kg)  07/19/17 133 lb (60.3 kg)    Physical Exam Constitutional:      General: He is not in acute distress.    Appearance: He is well-developed.     Comments: NAD  HENT:     Right Ear: There is impacted cerumen.     Left Ear: There is impacted cerumen.     Nose: Congestion and rhinorrhea present.     Mouth/Throat:     Pharynx: Posterior oropharyngeal erythema present. No oropharyngeal exudate.  Eyes:     Conjunctiva/sclera: Conjunctivae normal.     Pupils: Pupils are equal, round, and reactive to light.  Neck:     Musculoskeletal: Normal range of motion.     Thyroid: No thyromegaly.     Vascular: No JVD.  Cardiovascular:     Rate and Rhythm: Normal rate and regular rhythm.     Heart sounds: Normal heart sounds. No murmur. No friction rub. No gallop.   Pulmonary:     Effort: Pulmonary effort is normal. No respiratory distress.  Breath sounds: Normal breath sounds. No wheezing or rales.  Chest:     Chest wall: No tenderness.  Abdominal:     General: Bowel sounds are normal. There is no distension.     Palpations: Abdomen is soft. There is no mass.     Tenderness: There is no abdominal tenderness. There is no guarding or rebound.  Musculoskeletal: Normal range of motion.        General: No tenderness.  Lymphadenopathy:     Cervical: No cervical adenopathy.  Skin:    General: Skin is warm and dry.     Findings: No rash.  Neurological:     Mental Status: He is alert and oriented to person, place, and time.     Cranial Nerves: No cranial nerve deficit.     Motor: No abnormal muscle tone.     Coordination: Coordination normal.     Gait: Gait normal.     Deep Tendon Reflexes: Reflexes are normal and symmetric.    Psychiatric:        Behavior: Behavior normal.        Thought Content: Thought content normal.        Judgment: Judgment normal.   eryth throat B wax    Procedure Note :     Procedure :  Ear irrigation right and left ears   Indication:  Cerumen impaction right and left ears   Risks, including pain, dizziness, eardrum perforation, bleeding, infection and others as well as benefits were explained to the patient in detail. Verbal consent was obtained and the patient agreed to proceed.    We used "The Elephant Ear Irrigation Device" filled with lukewarm water for irrigation. A large amount wax was recovered from both ears. Procedure has also required manual wax removal/instrumentation with an ear wax curette and ear forceps on the right and left ears.   Tolerated well. Complications: None.   Postprocedure instructions :  Call if problems.    Lab Results  Component Value Date   WBC 4.5 05/21/2018   HGB 13.3 05/21/2018   HCT 39.7 05/21/2018   PLT 232.0 05/21/2018   GLUCOSE 75 05/21/2018   CHOL 170 05/17/2015   TRIG 78.0 05/17/2015   HDL 71.70 05/17/2015   LDLCALC 83 05/17/2015   ALT 10 04/30/2016   AST 14 04/30/2016   NA 138 05/21/2018   K 3.8 05/21/2018   CL 102 05/21/2018   CREATININE 0.87 05/21/2018   BUN 12 05/21/2018   CO2 30 05/21/2018   TSH 1.48 05/21/2018   PSA 0.51 04/30/2016   HGBA1C 4.7 05/21/2018    No results found.  Assessment & Plan:   There are no diagnoses linked to this encounter.   No orders of the defined types were placed in this encounter.    Follow-up: No follow-ups on file.  Sonda Primes, MD

## 2019-01-06 NOTE — Progress Notes (Signed)
Patient consent obtained. Irrigation with water and peroxide performed. Full view of tympanic membranes after procedure.  Patient tolerated procedure well.   

## 2019-03-02 DIAGNOSIS — M62830 Muscle spasm of back: Secondary | ICD-10-CM | POA: Insufficient documentation

## 2019-03-02 DIAGNOSIS — R55 Syncope and collapse: Secondary | ICD-10-CM | POA: Insufficient documentation

## 2019-10-22 LAB — HM COLONOSCOPY

## 2019-11-25 ENCOUNTER — Encounter: Payer: Self-pay | Admitting: Internal Medicine

## 2019-11-25 ENCOUNTER — Ambulatory Visit (INDEPENDENT_AMBULATORY_CARE_PROVIDER_SITE_OTHER): Payer: 59 | Admitting: Internal Medicine

## 2019-11-25 ENCOUNTER — Other Ambulatory Visit: Payer: Self-pay

## 2019-11-25 VITALS — BP 122/70 | HR 64 | Temp 98.6°F | Ht 66.0 in | Wt 145.0 lb

## 2019-11-25 DIAGNOSIS — Z0001 Encounter for general adult medical examination with abnormal findings: Secondary | ICD-10-CM

## 2019-11-25 DIAGNOSIS — E538 Deficiency of other specified B group vitamins: Secondary | ICD-10-CM | POA: Diagnosis not present

## 2019-11-25 DIAGNOSIS — Z7721 Contact with and (suspected) exposure to potentially hazardous body fluids: Secondary | ICD-10-CM

## 2019-11-25 DIAGNOSIS — E559 Vitamin D deficiency, unspecified: Secondary | ICD-10-CM

## 2019-11-25 DIAGNOSIS — J301 Allergic rhinitis due to pollen: Secondary | ICD-10-CM | POA: Diagnosis not present

## 2019-11-25 DIAGNOSIS — Z0000 Encounter for general adult medical examination without abnormal findings: Secondary | ICD-10-CM

## 2019-11-25 LAB — CBC WITH DIFFERENTIAL/PLATELET
Basophils Absolute: 0 10*3/uL (ref 0.0–0.1)
Basophils Relative: 1 % (ref 0.0–3.0)
Eosinophils Absolute: 0.2 10*3/uL (ref 0.0–0.7)
Eosinophils Relative: 7.1 % — ABNORMAL HIGH (ref 0.0–5.0)
HCT: 37.8 % — ABNORMAL LOW (ref 39.0–52.0)
Hemoglobin: 12.4 g/dL — ABNORMAL LOW (ref 13.0–17.0)
Lymphocytes Relative: 42.1 % (ref 12.0–46.0)
Lymphs Abs: 1.4 10*3/uL (ref 0.7–4.0)
MCHC: 32.8 g/dL (ref 30.0–36.0)
MCV: 95 fl (ref 78.0–100.0)
Monocytes Absolute: 0.4 10*3/uL (ref 0.1–1.0)
Monocytes Relative: 11.3 % (ref 3.0–12.0)
Neutro Abs: 1.3 10*3/uL — ABNORMAL LOW (ref 1.4–7.7)
Neutrophils Relative %: 38.5 % — ABNORMAL LOW (ref 43.0–77.0)
Platelets: 211 10*3/uL (ref 150.0–400.0)
RBC: 3.98 Mil/uL — ABNORMAL LOW (ref 4.22–5.81)
RDW: 12.8 % (ref 11.5–15.5)
WBC: 3.3 10*3/uL — ABNORMAL LOW (ref 4.0–10.5)

## 2019-11-25 LAB — BASIC METABOLIC PANEL
BUN: 12 mg/dL (ref 6–23)
CO2: 27 mEq/L (ref 19–32)
Calcium: 9 mg/dL (ref 8.4–10.5)
Chloride: 103 mEq/L (ref 96–112)
Creatinine, Ser: 0.76 mg/dL (ref 0.40–1.50)
GFR: 128.5 mL/min (ref >60.00)
Glucose, Bld: 88 mg/dL (ref 70–99)
Potassium: 3.8 mEq/L (ref 3.5–5.1)
Sodium: 139 mEq/L (ref 135–145)

## 2019-11-25 LAB — HEPATIC FUNCTION PANEL
ALT: 14 U/L (ref 0–53)
AST: 19 U/L (ref 0–37)
Albumin: 4.2 g/dL (ref 3.5–5.2)
Alkaline Phosphatase: 62 U/L (ref 39–117)
Bilirubin, Direct: 0.3 mg/dL (ref 0.0–0.3)
Total Bilirubin: 1.6 mg/dL — ABNORMAL HIGH (ref 0.2–1.2)
Total Protein: 6.8 g/dL (ref 6.0–8.3)

## 2019-11-25 LAB — VITAMIN B12: Vitamin B-12: 323 pg/mL (ref 211–911)

## 2019-11-25 LAB — URINALYSIS
Bilirubin Urine: NEGATIVE
Hgb urine dipstick: NEGATIVE
Ketones, ur: NEGATIVE
Leukocytes,Ua: NEGATIVE
Nitrite: NEGATIVE
Specific Gravity, Urine: 1.025 (ref 1.000–1.030)
Total Protein, Urine: NEGATIVE
Urine Glucose: NEGATIVE
Urobilinogen, UA: 1 (ref 0.0–1.0)
pH: 6.5 (ref 5.0–8.0)

## 2019-11-25 LAB — TSH: TSH: 1.64 u[IU]/mL (ref 0.35–4.50)

## 2019-11-25 LAB — LIPID PANEL
Cholesterol: 160 mg/dL (ref 0–200)
HDL: 66.9 mg/dL (ref >39.00)
LDL Cholesterol: 84 mg/dL (ref 0–99)
NonHDL: 93.41
Total CHOL/HDL Ratio: 2
Triglycerides: 48 mg/dL (ref 0.0–149.0)
VLDL: 9.6 mg/dL (ref 0.0–40.0)

## 2019-11-25 LAB — PSA: PSA: 0.51 ng/mL (ref 0.10–4.00)

## 2019-11-25 MED ORDER — VALACYCLOVIR HCL 500 MG PO TABS: ORAL_TABLET | ORAL | 2 refills | Status: DC

## 2019-11-25 MED ORDER — PSEUDOEPHEDRINE HCL 30 MG PO TABS: 60.0000 mg | ORAL_TABLET | ORAL | 2 refills | Status: DC | PRN

## 2019-11-25 MED ORDER — METHYLPREDNISOLONE ACETATE 80 MG/ML IJ SUSP
80.0000 mg | Freq: Once | INTRAMUSCULAR | Status: AC
  Administered 2019-11-25: 80 mg via INTRAMUSCULAR

## 2019-11-25 MED ORDER — VITAMIN B-12 1000 MCG SL SUBL: 1.0000 | SUBLINGUAL_TABLET | Freq: Every day | SUBLINGUAL | 3 refills | Status: AC

## 2019-11-25 MED ORDER — LORATADINE 10 MG PO TABS: 10.0000 mg | ORAL_TABLET | Freq: Every day | ORAL | 3 refills | Status: DC

## 2019-11-25 MED ORDER — TRIAMCINOLONE ACETONIDE 55 MCG/ACT NA AERO: 2.0000 | INHALATION_SPRAY | Freq: Every day | NASAL | 12 refills | Status: DC

## 2019-11-25 MED ORDER — VITAMIN D3 50 MCG (2000 UT) PO CAPS: 2000.0000 [IU] | ORAL_CAPSULE | Freq: Every day | ORAL | 3 refills | Status: AC

## 2019-11-25 NOTE — Progress Notes (Signed)
Subjective:  Patient ID: George [name], male    DOB: [birth year]  Age: 54 y.o. MRN: [id number]  CC: No chief complaint on file.   HPI George [name] presents for a well exam C/o allergies - worse F/u vit D and B12 def   Outpatient Medications Prior to Visit  Medication Sig Dispense Refill  . Cholecalciferol (VITAMIN D3) 2000 units capsule Take 1 capsule (2,000 Units total) by mouth daily. 100 capsule 3  . Cholecalciferol (VITAMIN D3) 50000 units CAPS Take 1 capsule by mouth once a week. 6 capsule 0  . Cyanocobalamin (VITAMIN B-12) 1000 MCG SUBL Place 1 tablet (1,000 mcg total) under the tongue daily. 100 tablet 3  . diphenhydrAMINE (BENADRYL) 25 MG tablet Take 2 tablets (50 mg total) by mouth every 4 (four) hours as needed for allergies (hives). 30 tablet 2  . loratadine (CLARITIN) 10 MG tablet Take 1 tablet (10 mg total) by mouth daily. 100 tablet 3  . predniSONE (DELTASONE) 10 MG tablet Take 40 mg po qd x 2 days prn hives 30 tablet 1  . pseudoephedrine (SUDAFED) 30 MG tablet Take 2 tablets (60 mg total) by mouth every 4 (four) hours as needed (hives). 30 tablet 2  . triamcinolone cream (KENALOG) 0.5 % Apply to affective shin area twice a day as neded 30 g 0  . valACYclovir (VALTREX) 500 MG tablet TAKE 1 TABLET (500 MG TOTAL) BY MOUTH 2 (TWO) TIMES DAILY FOR COLD SORES AS NEEDED 28 tablet 2  . sildenafil (VIAGRA) 100 MG tablet Take 1 tablet (100 mg total) by mouth as needed for erectile dysfunction. 30 tablet 5   No facility-administered medications prior to visit.    ROS: Review of Systems  Constitutional: Negative for appetite change, fatigue and unexpected weight change.  HENT: Positive for congestion. Negative for nosebleeds, sneezing, sore throat and trouble swallowing.   Eyes: Negative for itching and visual disturbance.  Respiratory: Negative for cough.   Cardiovascular: Negative for chest pain, palpitations and leg swelling.  Gastrointestinal: Negative for abdominal  distention, blood in stool, diarrhea and nausea.  Genitourinary: Negative for frequency and hematuria.  Musculoskeletal: Negative for back pain, gait problem, joint swelling and neck pain.  Skin: Negative for rash.  Neurological: Negative for dizziness, tremors, speech difficulty and weakness.  Psychiatric/Behavioral: Negative for agitation, dysphoric mood, sleep disturbance and suicidal ideas. The patient is not nervous/anxious.     Objective:  BP 122/70 (BP Location: Left Arm, Patient Position: Sitting, Cuff Size: Normal)   Pulse 64   Temp 98.6 F (37 C) (Oral)   Ht 5\' 6"  (1.676 m)   Wt 145 lb (65.8 kg)   SpO2 98%   BMI 23.40 kg/m   BP Readings from Last 3 Encounters:  11/25/19 122/70  01/06/19 120/74  05/21/18 114/70    Wt Readings from Last 3 Encounters:  11/25/19 145 lb (65.8 kg)  01/06/19 138 lb (62.6 kg)  05/21/18 133 lb (60.3 kg)    Physical Exam Constitutional:      General: He is not in acute distress.    Appearance: He is well-developed.     Comments: NAD  Eyes:     Conjunctiva/sclera: Conjunctivae normal.     Pupils: Pupils are equal, round, and reactive to light.  Neck:     Thyroid: No thyromegaly.     Vascular: No JVD.  Cardiovascular:     Rate and Rhythm: Normal rate and regular rhythm.     Heart sounds: Normal heart sounds. No murmur.  No friction rub. No gallop.   Pulmonary:     Effort: Pulmonary effort is normal. No respiratory distress.     Breath sounds: Normal breath sounds. No wheezing or rales.  Chest:     Chest wall: No tenderness.  Abdominal:     General: Bowel sounds are normal. There is no distension.     Palpations: Abdomen is soft. There is no mass.     Tenderness: There is no abdominal tenderness. There is no guarding or rebound.  Musculoskeletal:        General: No tenderness. Normal range of motion.     Cervical back: Normal range of motion.  Lymphadenopathy:     Cervical: No cervical adenopathy.  Skin:    General: Skin is  warm and dry.     Findings: No rash.  Neurological:     Mental Status: He is alert and oriented to person, place, and time.     Cranial Nerves: No cranial nerve deficit.     Motor: No abnormal muscle tone.     Coordination: Coordination normal.     Gait: Gait normal.     Deep Tendon Reflexes: Reflexes are normal and symmetric.  Psychiatric:        Behavior: Behavior normal.        Thought Content: Thought content normal.        Judgment: Judgment normal.    Rectal 1 mo ago - per GI  Lab Results  Component Value Date   WBC 4.5 05/21/2018   HGB 13.3 05/21/2018   HCT 39.7 05/21/2018   PLT 232.0 05/21/2018   GLUCOSE 75 05/21/2018   CHOL 170 05/17/2015   TRIG 78.0 05/17/2015   HDL 71.70 05/17/2015   LDLCALC 83 05/17/2015   ALT 10 04/30/2016   AST 14 04/30/2016   NA 138 05/21/2018   K 3.8 05/21/2018   CL 102 05/21/2018   CREATININE 0.87 05/21/2018   BUN 12 05/21/2018   CO2 30 05/21/2018   TSH 1.48 05/21/2018   PSA 0.51 04/30/2016   HGBA1C 4.7 05/21/2018    No results found.  Assessment & Plan:   There are no diagnoses linked to this encounter.   No orders of the defined types were placed in this encounter.    Follow-up: No follow-ups on file.  Walker Kehr, MD

## 2019-11-25 NOTE — Assessment & Plan Note (Addendum)
We discussed age appropriate health related issues, including available/recomended screening tests and vaccinations. We discussed a need for adhering to healthy diet and exercise. Labs were ordered to be later reviewed . All questions were answered. Colon 2020 in [place] due 2030 Shingrix vaccine later

## 2019-11-25 NOTE — Assessment & Plan Note (Signed)
Worse Nasocort, Claritin Depo-medrol 80 mg IM

## 2019-11-25 NOTE — Patient Instructions (Signed)
Shingrix vaccine

## 2019-11-25 NOTE — Addendum Note (Signed)
Addended by: Scarlett Presto on: 11/25/2019 10:50 AM   Modules accepted: Orders

## 2019-11-26 LAB — HIV ANTIBODY (ROUTINE TESTING W REFLEX): HIV 1&2 Ab, 4th Generation: NONREACTIVE

## 2019-11-27 LAB — GC/CHLAMYDIA PROBE AMP
Chlamydia trachomatis, NAA: NEGATIVE
Neisseria Gonorrhoeae by PCR: NEGATIVE

## 2020-04-26 ENCOUNTER — Encounter: Payer: Self-pay | Admitting: Internal Medicine

## 2020-04-26 ENCOUNTER — Telehealth (INDEPENDENT_AMBULATORY_CARE_PROVIDER_SITE_OTHER): Payer: 59 | Admitting: Internal Medicine

## 2020-04-26 DIAGNOSIS — R0609 Other forms of dyspnea: Secondary | ICD-10-CM | POA: Diagnosis not present

## 2020-04-26 DIAGNOSIS — J301 Allergic rhinitis due to pollen: Secondary | ICD-10-CM

## 2020-04-26 DIAGNOSIS — E538 Deficiency of other specified B group vitamins: Secondary | ICD-10-CM | POA: Diagnosis not present

## 2020-04-26 DIAGNOSIS — F419 Anxiety disorder, unspecified: Secondary | ICD-10-CM | POA: Diagnosis not present

## 2020-04-26 DIAGNOSIS — R0600 Dyspnea, unspecified: Secondary | ICD-10-CM | POA: Insufficient documentation

## 2020-04-26 MED ORDER — CETIRIZINE HCL 10 MG PO TABS: 10.0000 mg | ORAL_TABLET | Freq: Every day | ORAL | 11 refills | Status: DC | PRN

## 2020-04-26 MED ORDER — CETIRIZINE-PSEUDOEPHEDRINE ER 5-120 MG PO TB12: 1.0000 | ORAL_TABLET | Freq: Two times a day (BID) | ORAL | 5 refills | Status: DC | PRN

## 2020-04-26 NOTE — Assessment & Plan Note (Signed)
Zyrtec or Zyrtec D Pulm ref - Dr Maple Hudson: ?OSA vs other

## 2020-04-26 NOTE — Assessment & Plan Note (Signed)
Zyrtec or Zyrtec D Pulm ref - Dr Maple Hudson

## 2020-04-26 NOTE — Assessment & Plan Note (Signed)
May be contributing in the SOB issue

## 2020-04-26 NOTE — Progress Notes (Signed)
Virtual Visit via Video Note  I connected with George [name] on 04/26/20 at  8:50 AM EDT by a video enabled telemedicine application and verified that I am speaking with the correct person using two identifiers.   I discussed the limitations of evaluation and management by telemedicine and the availability of in person appointments. The patient expressed understanding and agreed to proceed.  I was located at our [facility] office. The patient was at home. There was no one else present in the visit.   History of Present Illness: C/o allergies - bad C/o being sleepy w/Claritin C/o gasping for air when waking up - ?sleep apnea    There has been a runny nose, mild cough. No chest pain, abdominal pain, diarrhea, constipation, arthralgias.   Observations/Objective: The patient appears to be in no acute distress, looks well.  Assessment and Plan:  See my Assessment and Plan. Follow Up Instructions:    I discussed the assessment and treatment plan with the patient. The patient was provided an opportunity to ask questions and all were answered. The patient agreed with the plan and demonstrated an understanding of the instructions.   The patient was advised to call back or seek an in-person evaluation if the symptoms worsen or if the condition fails to improve as anticipated.  I provided face-to-face time during this encounter. We were at different locations.   Sonda Primes, MD

## 2020-04-26 NOTE — Assessment & Plan Note (Signed)
On B12 

## 2020-06-09 ENCOUNTER — Ambulatory Visit (INDEPENDENT_AMBULATORY_CARE_PROVIDER_SITE_OTHER): Payer: 59 | Admitting: Emergency Medicine

## 2020-06-09 ENCOUNTER — Other Ambulatory Visit: Payer: Self-pay

## 2020-06-09 ENCOUNTER — Encounter: Payer: Self-pay | Admitting: Emergency Medicine

## 2020-06-09 VITALS — BP 112/66 | HR 80 | Temp 98.2°F | Ht 66.0 in | Wt 140.2 lb

## 2020-06-09 DIAGNOSIS — G479 Sleep disorder, unspecified: Secondary | ICD-10-CM

## 2020-06-09 NOTE — Progress Notes (Signed)
Subjective:    Patient ID: George [name], male    DOB: [birth year], 54 y.o.   MRN: [id number]  HPI 54 year old never smoker with a history of eczema, allergic rhinitis, anxiety and depression, OCD. He is referred today for evaluation of dyspnea, gasping for air.   He describes gasping for air and waking himself up - initially feeling SOB but then recovers quickly once awake. He snores and his daughter has heard the gasping. He has sleep paralysis - has had it for many years. He avoids sleeping on his back for this reason. He doesn't really nap during the day, but states that he can sometimes have near-sleep episodes when standing for an extended period, catching himself before falling. He has noticed some jerking movements of his LE during sleep - can wake him up. Has taken ambien before remotely. Never requip or any other sleep meds.   He has significant sinus disease, is on zyrtec. Difficulty getting air through his nose especially on the R. He has seen ENT before and sounds like he has a deviated septum. Had to stop nasacort - makes him nauseated. He had sinus surgery scheduled but then deferred it due to cost.    Review of Systems As per HPI  Past Medical History:  Diagnosis Date  . Allergy   . Anxiety   . Depression   . Eczema   . ED (erectile dysfunction)   . H/O: hematuria   . OCD (obsessive compulsive disorder)   . OCD (obsessive compulsive disorder)      Family History  Problem Relation Age of Onset  . Heart disease Mother        valve  . Asthma Mother   . Cancer Father 68       prostate ca, colon ca  . Cancer Paternal Uncle        lung ca     Social History   Socioeconomic History  . Marital status: Divorced    Spouse name: Not on file  . Number of children: Not on file  . Years of education: Not on file  . Highest education level: Not on file  Occupational History  . Not on file  Tobacco Use  . Smoking status: Never Smoker  . Smokeless tobacco: Never  Used  Substance and Sexual Activity  . Alcohol use: Yes  . Drug use: No  . Sexual activity: Yes    Birth control/protection: Surgical    Comment: 2014  Other Topics Concern  . Not on file  Social History Narrative  . Not on file   Social Determinants of Health   Financial Resource Strain:   . Difficulty of Paying Living Expenses:   Food Insecurity:   . Worried About Programme researcher, broadcasting/film/video in the Last Year:   . Barista in the Last Year:   Transportation Needs:   . Freight forwarder (Medical):   Marland Kitchen Lack of Transportation (Non-Medical):   Physical Activity:   . Days of Exercise per Week:   . Minutes of Exercise per Session:   Stress:   . Feeling of Stress :   Social Connections:   . Frequency of Communication with Friends and Family:   . Frequency of Social Gatherings with Friends and Family:   . Attends Religious Services:   . Active Member of Clubs or Organizations:   . Attends Banker Meetings:   Marland Kitchen Marital Status:   Intimate Partner Violence:   . Fear of Current  or Ex-Partner:   . Emotionally Abused:   Marland Kitchen Physically Abused:   . Sexually Abused:      Allergies  Allergen Reactions  . Aspirin   . Paroxetine     REACTION: tired     Outpatient Medications Prior to Visit  Medication Sig Dispense Refill  . cetirizine (ZYRTEC) 10 MG tablet Take 1 tablet (10 mg total) by mouth daily as needed for allergies or rhinitis. 30 tablet 11  . cetirizine-pseudoephedrine (ZYRTEC-D) 5-120 MG tablet Take 1 tablet by mouth 2 (two) times daily as needed for allergies or rhinitis. Use in place of Zyrtec 10 mg for bad allergies 60 tablet 5  . Cholecalciferol (VITAMIN D3) 50 MCG (2000 UT) capsule Take 1 capsule (2,000 Units total) by mouth daily. 100 capsule 3  . Cyanocobalamin (VITAMIN B-12) 1000 MCG SUBL Place 1 tablet (1,000 mcg total) under the tongue daily. 100 tablet 3  . triamcinolone (NASACORT) 55 MCG/ACT AERO nasal inhaler Place 2 sprays into the nose daily.  1 Inhaler 12  . triamcinolone cream (KENALOG) 0.5 % Apply to affective shin area twice a day as neded 30 g 0  . valACYclovir (VALTREX) 500 MG tablet TAKE 1 TABLET (500 MG TOTAL) BY MOUTH 2 (TWO) TIMES DAILY FOR COLD SORES AS NEEDED 28 tablet 2  . diphenhydrAMINE (BENADRYL) 25 MG tablet Take 2 tablets (50 mg total) by mouth every 4 (four) hours as needed for allergies (hives). (Patient not taking: Reported on 06/09/2020) 30 tablet 2  . sildenafil (VIAGRA) 100 MG tablet Take 1 tablet (100 mg total) by mouth as needed for erectile dysfunction. 30 tablet 5   No facility-administered medications prior to visit.         Objective:   Physical Exam  Vitals:   06/09/20 1603  BP: 112/66  Pulse: 80  Temp: 98.2 F (36.8 C)  TempSrc: Oral  SpO2: 98%  Weight: 140 lb 3.2 oz (63.6 kg)  Height: 5\' 6"  (1.676 m)   Gen: Pleasant, well-nourished, in no distress,  normal affect  ENT: No lesions,  mouth clear,  oropharynx clear, no postnasal drip  Neck: No JVD, no stridor  Lungs: No use of accessory muscles, no crackles or wheezing on normal respiration, no wheeze on forced expiration  Cardiovascular: RRR, heart sounds normal, no murmur or gallops, no peripheral edema  Musculoskeletal: No deformities, no cyanosis or clubbing  Neuro: alert, awake, non focal  Skin: Warm, no lesions or rash     Assessment & Plan:  Sleep disorder He snores and has had periods of gasping, waking him up which could be consistent with OSA.  His nasal septal deviation and sinus disease may be contributing some.  Also with limb movements consistent with restless leg syndrome.  Interestingly he also suffers from longstanding sleep paralysis and has unintentional sleep even when standing still at work.  He has almost fallen after such episodes.  No clear episodes of cataplexy that he can recall.  I will arrange for a split-night sleep study and also a sleep latency test.  He can then follow-up in our office with one of our  sleep MDs to discuss the results and decide next steps.  , MD, PhD 06/09/2020, 4:43 PM [facility] [phone] or if no answer [phone]

## 2020-06-09 NOTE — Assessment & Plan Note (Signed)
He snores and has had periods of gasping, waking him up which could be consistent with OSA.  His nasal septal deviation and sinus disease may be contributing some.  Also with limb movements consistent with restless leg syndrome.  Interestingly he also suffers from longstanding sleep paralysis and has unintentional sleep even when standing still at work.  He has almost fallen after such episodes.  No clear episodes of cataplexy that he can recall.  I will arrange for a split-night sleep study and also a sleep latency test.  He can then follow-up in our office with one of our sleep MDs to discuss the results and decide next steps.

## 2020-06-09 NOTE — Patient Instructions (Signed)
We will order sleep testing to be done. Our coordinators will investigate the cost and insurance coverage and inform you before scheduling.  We will arrange for you to follow up with one of our sleep experts after your testing to continue the evaluation.

## 2020-06-29 ENCOUNTER — Telehealth: Payer: Self-pay | Admitting: Pulmonary Disease

## 2020-06-29 NOTE — Telephone Encounter (Signed)
Waiting on response from [facility] it takes a couple of weeks with them sometimes [name]

## 2020-06-29 NOTE — Telephone Encounter (Signed)
Patient is aware of below message and voiced his understanding.  Nothing further is needed at this time.  

## 2020-06-29 NOTE — Telephone Encounter (Signed)
Left message for patient

## 2020-07-04 ENCOUNTER — Other Ambulatory Visit: Payer: Self-pay

## 2020-07-04 ENCOUNTER — Ambulatory Visit: Payer: 59 | Admitting: Internal Medicine

## 2020-07-04 ENCOUNTER — Encounter: Payer: Self-pay | Admitting: Internal Medicine

## 2020-07-04 DIAGNOSIS — R21 Rash and other nonspecific skin eruption: Secondary | ICD-10-CM | POA: Diagnosis not present

## 2020-07-04 MED ORDER — METHYLPREDNISOLONE 4 MG PO TBPK
ORAL_TABLET | ORAL | 0 refills | Status: DC
Start: 2020-07-04 — End: 2020-10-31

## 2020-07-04 MED ORDER — CLOTRIMAZOLE-BETAMETHASONE 1-0.05 % EX CREA: 1.0000 "application " | TOPICAL_CREAM | Freq: Two times a day (BID) | CUTANEOUS | 1 refills | Status: DC

## 2020-07-04 MED ORDER — METHYLPREDNISOLONE ACETATE 80 MG/ML IJ SUSP
80.0000 mg | Freq: Once | INTRAMUSCULAR | Status: AC
  Administered 2020-07-04: 80 mg via INTRAMUSCULAR

## 2020-07-04 MED ORDER — HYDROXYZINE HCL 25 MG PO TABS: 25.0000 mg | ORAL_TABLET | Freq: Three times a day (TID) | ORAL | 0 refills | Status: DC | PRN

## 2020-07-04 MED ORDER — METHYLPREDNISOLONE 4 MG PO TBPK: ORAL_TABLET | ORAL | 0 refills | Status: DC

## 2020-07-04 NOTE — Progress Notes (Signed)
Subjective:  Patient ID: George [name], male    DOB: 05-01-1966  Age: 54 y.o. MRN: [id number]  CC: Rash (x 2weeks, itching)   HPI Babacar Haycraft presents for itching in the groin, no rash x wks  Outpatient Medications Prior to Visit  Medication Sig Dispense Refill  . cetirizine (ZYRTEC) 10 MG tablet Take 1 tablet (10 mg total) by mouth daily as needed for allergies or rhinitis. 30 tablet 11  . cetirizine-pseudoephedrine (ZYRTEC-D) 5-120 MG tablet Take 1 tablet by mouth 2 (two) times daily as needed for allergies or rhinitis. Use in place of Zyrtec 10 mg for bad allergies 60 tablet 5  . Cholecalciferol (VITAMIN D3) 50 MCG (2000 UT) capsule Take 1 capsule (2,000 Units total) by mouth daily. 100 capsule 3  . Cyanocobalamin (VITAMIN B-12) 1000 MCG SUBL Place 1 tablet (1,000 mcg total) under the tongue daily. 100 tablet 3  . diphenhydrAMINE (BENADRYL) 25 MG tablet Take 2 tablets (50 mg total) by mouth every 4 (four) hours as needed for allergies (hives). (Patient not taking: Reported on 06/09/2020) 30 tablet 2  . sildenafil (VIAGRA) 100 MG tablet Take 1 tablet (100 mg total) by mouth as needed for erectile dysfunction. 30 tablet 5  . triamcinolone (NASACORT) 55 MCG/ACT AERO nasal inhaler Place 2 sprays into the nose daily. (Patient not taking: Reported on 07/04/2020) 1 Inhaler 12  . triamcinolone cream (KENALOG) 0.5 % Apply to affective shin area twice a day as neded (Patient not taking: Reported on 07/04/2020) 30 g 0  . valACYclovir (VALTREX) 500 MG tablet TAKE 1 TABLET (500 MG TOTAL) BY MOUTH 2 (TWO) TIMES DAILY FOR COLD SORES AS NEEDED (Patient not taking: Reported on 07/04/2020) 28 tablet 2   No facility-administered medications prior to visit.    ROS: Review of Systems  Constitutional: Negative for appetite change and fatigue.  HENT: Negative for congestion, nosebleeds, sneezing, sore throat and trouble swallowing.   Eyes: Negative for itching and visual disturbance.  Respiratory:  Negative for cough.   Cardiovascular: Negative for chest pain, palpitations and leg swelling.  Gastrointestinal: Negative for abdominal distention, blood in stool, diarrhea and nausea.  Genitourinary: Negative for frequency and hematuria.  Musculoskeletal: Negative for back pain, gait problem, joint swelling and neck pain.  Skin: Negative for rash.  Neurological: Negative for dizziness, tremors, speech difficulty and weakness.  Psychiatric/Behavioral: Negative for agitation, dysphoric mood and sleep disturbance. The patient is not nervous/anxious.     Objective:  BP 102/62 (BP Location: Right Arm, Patient Position: Sitting, Cuff Size: Normal)   Pulse 79   Temp 97.6 F (36.4 C) (Oral)   Ht 5\' 6"  (1.676 m)   Wt 143 lb 6.4 oz (65 kg)   SpO2 97%   BMI 23.15 kg/m   BP Readings from Last 3 Encounters:  07/04/20 102/62  06/09/20 112/66  11/25/19 122/70    Wt Readings from Last 3 Encounters:  07/04/20 143 lb 6.4 oz (65 kg)  06/09/20 140 lb 3.2 oz (63.6 kg)  11/25/19 145 lb (65.8 kg)    Physical Exam Constitutional:      General: He is not in acute distress.    Appearance: He is well-developed.     Comments: NAD  Eyes:     Conjunctiva/sclera: Conjunctivae normal.     Pupils: Pupils are equal, round, and reactive to light.  Neck:     Thyroid: No thyromegaly.     Vascular: No JVD.  Cardiovascular:     Rate and Rhythm: Normal rate  and regular rhythm.     Heart sounds: Normal heart sounds. No murmur heard.  No friction rub. No gallop.   Pulmonary:     Effort: Pulmonary effort is normal. No respiratory distress.     Breath sounds: Normal breath sounds. No wheezing or rales.  Chest:     Chest wall: No tenderness.  Abdominal:     General: Bowel sounds are normal. There is no distension.     Palpations: Abdomen is soft. There is no mass.     Tenderness: There is no abdominal tenderness. There is no guarding or rebound.  Musculoskeletal:        General: No tenderness. Normal  range of motion.     Cervical back: Normal range of motion.  Lymphadenopathy:     Cervical: No cervical adenopathy.  Skin:    General: Skin is warm and dry.     Findings: No erythema or rash.  Neurological:     Mental Status: He is alert and oriented to person, place, and time.     Cranial Nerves: No cranial nerve deficit.     Motor: No abnormal muscle tone.     Coordination: Coordination normal.     Gait: Gait normal.     Deep Tendon Reflexes: Reflexes are normal and symmetric.  Psychiatric:        Behavior: Behavior normal.        Thought Content: Thought content normal.        Judgment: Judgment normal.   L knee w/a scar  Lab Results  Component Value Date   WBC 3.3 (L) 11/25/2019   HGB 12.4 (L) 11/25/2019   HCT 37.8 (L) 11/25/2019   PLT 211.0 11/25/2019   GLUCOSE 88 11/25/2019   CHOL 160 11/25/2019   TRIG 48.0 11/25/2019   HDL 66.90 11/25/2019   LDLCALC 84 11/25/2019   ALT 14 11/25/2019   AST 19 11/25/2019   NA 139 11/25/2019   K 3.8 11/25/2019   CL 103 11/25/2019   CREATININE 0.76 11/25/2019   BUN 12 11/25/2019   CO2 27 11/25/2019   TSH 1.64 11/25/2019   PSA 0.51 11/25/2019   HGBA1C 4.7 05/21/2018    No results found.  Assessment & Plan:    Sonda Primes, MD

## 2020-07-04 NOTE — Assessment & Plan Note (Signed)
?  intertrigo 8/21 Lotrisone Medrol pack Hydroxyzine prn

## 2020-07-13 ENCOUNTER — Telehealth: Payer: Self-pay | Admitting: Pulmonary Disease

## 2020-07-13 NOTE — Telephone Encounter (Signed)
Spoke to the pt to let him know that the sleep center would need to let him know about pricing. Pt stated that the sleep center told him to call our office for the charges.  I told him I would check into this in the morning.

## 2020-07-14 NOTE — Telephone Encounter (Signed)
LM on Sleep Center's VM to call me about this one.  LM on pt's VM w/ an update.

## 2020-07-19 NOTE — Telephone Encounter (Signed)
Spoke to the [name] sleep /center who advised for a split night sleep study, it is roughly $3,700.  The patient wouldn't be expected to make a payment when they come in for the study.  This will be billed to the insurance & then the patient would be billed for the balance.  Left a detailed message on pt's VM w/ this information.

## 2020-08-09 ENCOUNTER — Institutional Professional Consult (permissible substitution): Payer: 59 | Admitting: Pulmonary Disease

## 2020-09-11 ENCOUNTER — Encounter (HOSPITAL_BASED_OUTPATIENT_CLINIC_OR_DEPARTMENT_OTHER): Payer: 59 | Admitting: Pulmonary Disease

## 2020-09-12 ENCOUNTER — Encounter (HOSPITAL_BASED_OUTPATIENT_CLINIC_OR_DEPARTMENT_OTHER): Payer: 59 | Admitting: Pulmonary Disease

## 2020-09-12 ENCOUNTER — Ambulatory Visit (HOSPITAL_BASED_OUTPATIENT_CLINIC_OR_DEPARTMENT_OTHER): Payer: 59 | Admitting: Pulmonary Disease

## 2020-09-13 ENCOUNTER — Encounter (HOSPITAL_BASED_OUTPATIENT_CLINIC_OR_DEPARTMENT_OTHER): Payer: 59 | Admitting: Pulmonary Disease

## 2020-09-30 ENCOUNTER — Institutional Professional Consult (permissible substitution): Payer: 59 | Admitting: Pulmonary Disease

## 2020-10-09 ENCOUNTER — Ambulatory Visit (HOSPITAL_BASED_OUTPATIENT_CLINIC_OR_DEPARTMENT_OTHER): Payer: 59 | Attending: Emergency Medicine | Admitting: Pulmonary Disease

## 2020-10-09 ENCOUNTER — Other Ambulatory Visit: Payer: Self-pay

## 2020-10-09 DIAGNOSIS — G479 Sleep disorder, unspecified: Secondary | ICD-10-CM | POA: Insufficient documentation

## 2020-10-09 DIAGNOSIS — R0683 Snoring: Secondary | ICD-10-CM | POA: Diagnosis not present

## 2020-10-10 ENCOUNTER — Ambulatory Visit (HOSPITAL_BASED_OUTPATIENT_CLINIC_OR_DEPARTMENT_OTHER): Payer: 59 | Attending: Emergency Medicine | Admitting: Pulmonary Disease

## 2020-10-10 VITALS — Ht 67.0 in | Wt 145.0 lb

## 2020-10-10 DIAGNOSIS — G4710 Hypersomnia, unspecified: Secondary | ICD-10-CM | POA: Diagnosis not present

## 2020-10-10 DIAGNOSIS — G479 Sleep disorder, unspecified: Secondary | ICD-10-CM

## 2020-10-10 DIAGNOSIS — G4711 Idiopathic hypersomnia with long sleep time: Secondary | ICD-10-CM | POA: Diagnosis not present

## 2020-10-11 DIAGNOSIS — G479 Sleep disorder, unspecified: Secondary | ICD-10-CM | POA: Diagnosis not present

## 2020-10-11 NOTE — Procedures (Signed)
    Patient Name: George [name], George [name] Date: 10/10/2020 Gender: Male D.O.B: 01/23/1966 Age (years): 54 Referring Provider: Levy Pupa Height (inches): 67 Interpreting Physician: Coralyn Helling MD, ABSM Weight (lbs): 145 RPSGT: George [name] Sink BMI: 23 MRN: [id number] Neck Size: 15.00  CLINICAL INFORMATION Sleep Study Type: MSLT  The patient was referred to the sleep center for evaluation of daytime sleepiness.  Epworth Sleepiness Score: 15  SLEEP STUDY TECHNIQUE A Multiple Sleep Latency Test was performed after an overnight polysomnogram according to the AASM scoring manual v2.3 (April 2016) and clinical guidelines. Five nap opportunities occurred over the course of the test which followed an overnight polysomnogram. The channels recorded and monitored were frontal, central, and occipital electroencephalography (EEG), right and left electrooculogram (EOG), chin electromyography (EMG), and electrocardiogram (EKG).  MEDICATIONS Medications taken by the patient : N/A Medications administered by patient during sleep study : No sleep medicine administered.  IMPRESSIONS - Total number of naps attempted: 5 . Total number of naps with sleep attained: 5. The Mean Sleep Latency was 6 minutes 25 seconds. There were no sleep-onset REM periods. - The patient appears to have pathologic sleepiness, evidenced by a short mean sleep latency (8 minutes or less) on this MSLT.  DIAGNOSIS - Pathologic Sleepiness (G47.10) - Idiopathic hypersomnia (G47.11)  RECOMMENDATIONS - Return for follow up and management of Idiopathic Hypersomnia. - Return for follow up to evaluate other causes of excessive daytime sleepiness.  [Electronically signed] 10/11/2020 10:14 AM  Coralyn Helling MD, ABSM Diplomate, American Board of Sleep Medicine   NPI: [id number]

## 2020-10-11 NOTE — Procedures (Signed)
    Patient Name: George [name], George [name] Date: 10/09/2020 Gender: Male D.O.B: 05-24-1966 Age (years): 54 Referring Provider: Levy Pupa Height (inches): 67 Interpreting Physician: Coralyn Helling MD, ABSM Weight (lbs): 145 RPSGT: Rosette Reveal BMI: 23 MRN: [id number] Neck Size: 15.00  CLINICAL INFORMATION Sleep Study Type: NPSG  Indication for sleep study: snoring, daytime sleepiness, sleep paralysis.  Epworth Sleepiness Score: 15  SLEEP STUDY TECHNIQUE As per the AASM Manual for the Scoring of Sleep and Associated Events v2.3 (April 2016) with a hypopnea requiring 4% desaturations.  The channels recorded and monitored were frontal, central and occipital EEG, electrooculogram (EOG), submentalis EMG (chin), nasal and oral airflow, thoracic and abdominal wall motion, anterior tibialis EMG, snore microphone, electrocardiogram, and pulse oximetry.  MEDICATIONS Medications self-administered by patient taken the night of the study : N/A  SLEEP ARCHITECTURE The study was initiated at 10:20:50 PM and ended at 6:00:16 AM.  Sleep onset time was 57.7 minutes and the sleep efficiency was 67.4%%. The total sleep time was 309.5 minutes.  Stage REM latency was 100.0 minutes.  The patient spent 6.1%% of the night in stage N1 sleep, 70.3%% in stage N2 sleep, 0.0%% in stage N3 and 23.6% in REM.  Alpha intrusion was absent.  Supine sleep was 52.34%.  RESPIRATORY PARAMETERS The overall apnea/hypopnea index (AHI) was 0.0 per hour. There were 0 total apneas, including 0 obstructive, 0 central and 0 mixed apneas. There were 0 hypopneas and 9 RERAs.  The AHI during Stage REM sleep was 0.0 per hour.  AHI while supine was 0.0 per hour.  The mean oxygen saturation was 95.9%. The minimum SpO2 during sleep was 94.0%.  soft snoring was noted during this study.  CARDIAC DATA The 2 lead EKG demonstrated sinus rhythm. The mean heart rate was 68.0 beats per minute. Other EKG findings include:  None.  LEG MOVEMENT DATA The total PLMS were 0 with a resulting PLMS index of 0.0. Associated arousal with leg movement index was 0.0 .  IMPRESSIONS - No significant obstructive sleep apnea occurred during this study (AHI = 0.0/h). - No significant central sleep apnea occurred during this study (CAI = 0.0/h). - The patient had minimal or no oxygen desaturation during the study (Min O2 = 94.0%) - The patient snored with soft snoring volume. - No cardiac abnormalities were noted during this study. - Clinically significant periodic limb movements did not occur during sleep. No significant associated arousals.  DIAGNOSIS - Snoring.  RECOMMENDATIONS - Please correlate with multiple sleep latency results from 10/10/20.  [Electronically signed] 10/11/2020 10:04 AM  Coralyn Helling MD, ABSM Diplomate, American Board of Sleep Medicine   NPI: [id number]

## 2020-10-18 ENCOUNTER — Other Ambulatory Visit: Payer: Self-pay | Admitting: Internal Medicine

## 2020-10-18 MED ORDER — CLOTRIMAZOLE-BETAMETHASONE 1-0.05 % EX CREA: 1.0000 "application " | TOPICAL_CREAM | Freq: Two times a day (BID) | CUTANEOUS | 1 refills | Status: AC

## 2020-10-18 NOTE — Telephone Encounter (Signed)
Patient is requesting a refill on the following medication : clotrimazole-betamethasone (LOTRISONE) cream  Send to pharmacy on file.

## 2020-10-19 NOTE — Telephone Encounter (Signed)
MD SENT TO POF.Marland KitchenRedgie Grayer

## 2020-10-31 ENCOUNTER — Other Ambulatory Visit: Payer: Self-pay

## 2020-10-31 ENCOUNTER — Telehealth (INDEPENDENT_AMBULATORY_CARE_PROVIDER_SITE_OTHER): Payer: 59 | Admitting: Family

## 2020-10-31 DIAGNOSIS — J019 Acute sinusitis, unspecified: Secondary | ICD-10-CM

## 2020-10-31 MED ORDER — AMOXICILLIN-POT CLAVULANATE 875-125 MG PO TABS: 1.0000 | ORAL_TABLET | Freq: Two times a day (BID) | ORAL | 0 refills | Status: AC

## 2020-10-31 MED ORDER — PREDNISONE 20 MG PO TABS: 20.0000 mg | ORAL_TABLET | Freq: Every day | ORAL | 0 refills | Status: DC

## 2020-10-31 NOTE — Progress Notes (Signed)
George [name] is a 54 y.o. male with the following history as recorded in [employer]:  Patient Active Problem List   Diagnosis Date Noted  . Rash in adult 07/04/2020  . Sleep disorder 06/09/2020  . Dyspnea 04/26/2020  . Mucocele of lower lip 05/21/2018  . Callus 07/19/2017  . Hives 04/16/2016  . Tremor of right hand 12/07/2015  . Vitamin D deficiency 11/28/2015  . Nonspecific abnormal electrocardiogram (ECG) (EKG) 05/17/2015  . Cerumen impaction 05/17/2015  . Scabies infestation 03/16/2015  . Erectile dysfunction 02/14/2015  . Leg pain, bilateral 01/21/2015  . Bursitis of left shoulder 11/03/2014  . External hemorrhoid 02/23/2014  . B12 deficiency 09/02/2013  . Diarrhea 03/02/2013  . Hypokalemia 03/02/2013  . Lactose intolerance 03/02/2013  . Cold sore 06/09/2012  . Well adult exam 08/22/2011  . Abnormal CBC 08/22/2011  . Nausea & vomiting 08/07/2011  . Sinusitis, acute 03/09/2011  . Migraine headache 03/09/2011  . HEMATURIA UNSPECIFIED 08/15/2010  . Allergic rhinitis 03/08/2010  . ECZEMA 11/02/2008  . HYPERBILIRUBINEMIA 02/09/2008  . WARTS, VIRAL, UNSPECIFIED 12/02/2007  . GANGLION 12/02/2007  . HERPES SIMPLEX INFECTION 06/06/2007  . DERMATOPHYTOSIS, SCALP 06/06/2007  . Anxiety disorder 06/06/2007  . DEPRESSION 06/06/2007  . IBS 06/06/2007  . INSOMNIA 06/06/2007    Current Outpatient Medications  Medication Sig Dispense Refill  . amoxicillin-clavulanate (AUGMENTIN) 875-125 MG tablet Take 1 tablet by mouth 2 (two) times daily for 10 days. 20 tablet 0  . cetirizine (ZYRTEC) 10 MG tablet Take 1 tablet (10 mg total) by mouth daily as needed for allergies or rhinitis. 30 tablet 11  . cetirizine-pseudoephedrine (ZYRTEC-D) 5-120 MG tablet Take 1 tablet by mouth 2 (two) times daily as needed for allergies or rhinitis. Use in place of Zyrtec 10 mg for bad allergies 60 tablet 5  . Cholecalciferol (VITAMIN D3) 50 MCG (2000 UT) capsule Take 1 capsule (2,000 Units total) by mouth  daily. 100 capsule 3  . clotrimazole-betamethasone (LOTRISONE) cream Apply 1 application topically 2 (two) times daily. 45 g 1  . Cyanocobalamin (VITAMIN B-12) 1000 MCG SUBL Place 1 tablet (1,000 mcg total) under the tongue daily. 100 tablet 3  . hydrOXYzine (ATARAX/VISTARIL) 25 MG tablet Take 1-2 tablets (25-50 mg total) by mouth every 8 (eight) hours as needed for itching. 60 tablet 0  . predniSONE (DELTASONE) 20 MG tablet Take 1 tablet (20 mg total) by mouth daily with breakfast. 5 tablet 0  . sildenafil (VIAGRA) 100 MG tablet Take 1 tablet (100 mg total) by mouth as needed for erectile dysfunction. 30 tablet 5  . triamcinolone (NASACORT) 55 MCG/ACT AERO nasal inhaler Place 2 sprays into the nose daily. (Patient not taking: Reported on 07/04/2020) 1 Inhaler 12   No current facility-administered medications for this visit.    Allergies: Aspirin and Paroxetine  Past Medical History:  Diagnosis Date  . Allergy   . Anxiety   . Depression   . Eczema   . ED (erectile dysfunction)   . H/O: hematuria   . OCD (obsessive compulsive disorder)   . OCD (obsessive compulsive disorder)     Past Surgical History:  Procedure Laterality Date  . VASECTOMY      Family History  Problem Relation Age of Onset  . Heart disease Mother        valve  . Asthma Mother   . Cancer Father 38       prostate ca, colon ca  . Cancer Paternal Uncle        lung ca  Social History   Tobacco Use  . Smoking status: Never Smoker  . Smokeless tobacco: Never Used  Substance Use Topics  . Alcohol use: Yes    Subjective:   I connected with George [name] on 10/31/20 at  2:20 PM EST by a video enabled telemedicine application and verified that I am speaking with the correct person using two identifiers.   I discussed the limitations of evaluation and management by telemedicine and the availability of in person appointments. The patient expressed understanding and agreed to proceed.  Patient is concerned that  he has sinus infection; notes he gets these often- has actually been recommended to have sinus surgery in the past but was cost prohibitive; + facial pain/ pressure; thick, discolored mucus; is taking his allergy medication; scheduled to see pulmonology in January 2022;     Objective:  There were no vitals filed for this visit.  Lungs: Respirations unlabored;  Neurologic: Alert and oriented; speech intact; face symmetrical; moves all extremities well; CNII-XII intact without focal deficit  Assessment:  1. Acute sinusitis, recurrence not specified, unspecified location     Plan:  Rx for Augmentin 875 mg bid x 10 days; Rx for Prednisone 20 mg qd x 5 days; follow-up worse, no better and will consider COVID testing; Will also consider referral back to ENT after he sees the pulmonologist.  No follow-ups on file.  No orders of the defined types were placed in this encounter.   Requested Prescriptions   Signed Prescriptions Disp Refills  . amoxicillin-clavulanate (AUGMENTIN) 875-125 MG tablet 20 tablet 0    Sig: Take 1 tablet by mouth 2 (two) times daily for 10 days.  . predniSONE (DELTASONE) 20 MG tablet 5 tablet 0    Sig: Take 1 tablet (20 mg total) by mouth daily with breakfast.

## 2020-11-25 ENCOUNTER — Telehealth: Payer: Self-pay | Admitting: Internal Medicine

## 2020-11-25 ENCOUNTER — Encounter: Payer: Self-pay | Admitting: Pulmonary Disease

## 2020-11-25 ENCOUNTER — Ambulatory Visit (INDEPENDENT_AMBULATORY_CARE_PROVIDER_SITE_OTHER): Payer: 59 | Admitting: Pulmonary Disease

## 2020-11-25 ENCOUNTER — Other Ambulatory Visit: Payer: Self-pay

## 2020-11-25 VITALS — BP 100/70 | HR 78 | Temp 97.2°F | Ht 67.0 in | Wt 145.4 lb

## 2020-11-25 DIAGNOSIS — G478 Other sleep disorders: Secondary | ICD-10-CM

## 2020-11-25 DIAGNOSIS — J320 Chronic maxillary sinusitis: Secondary | ICD-10-CM

## 2020-11-25 DIAGNOSIS — J310 Chronic rhinitis: Secondary | ICD-10-CM | POA: Diagnosis not present

## 2020-11-25 DIAGNOSIS — J301 Allergic rhinitis due to pollen: Secondary | ICD-10-CM

## 2020-11-25 NOTE — Telephone Encounter (Signed)
    Patient requesting referral to ENT [facility] Reason: sinus issues

## 2020-11-25 NOTE — Patient Instructions (Signed)
Follow up as needed if your sleep pattern gets worse 

## 2020-11-25 NOTE — Progress Notes (Signed)
[facility]  Chief Complaint  Patient presents with  . Consult    Sleep study completed.  Wakes up gasping for air.    Constitutional:  BP 100/70 (BP Location: Right Arm, Patient Position: Sitting, Cuff Size: Normal)   Pulse 78   Temp (!) 97.2 F (36.2 C) (Temporal)   Ht 5\' 7"  (1.702 m)   Wt 145 lb 6.4 oz (66 kg)   SpO2 99%   BMI 22.77 kg/m   Past Medical History:  Allergies, Anxiety, Depression, Eczema, ED, OCD  Past Surgical History:  He  has a past surgical history that includes Vasectomy.  Brief Summary:  George [name] is a 55 y.o. male with isolated sleep paralysis.      Subjective:   He was initially seen by Dr. 59.  Had PSG and then MSLT.  No sleep apnea or PLMI.  Had 5 of 5 naps with sleep, but no SOREM.  He gets intermittent episodes of sleep paralysis.  These happen more when sleeping on his back and when his sinus are causing trouble.  He doesn't feel sleepy during the day.  No symptoms to suggest sleep hallucinations.  Denies cataplexy or restless legs.  Intermittently gets cramps in his feet.  Physical Exam:   Appearance - well kempt   ENMT - no sinus tenderness, no oral exudate, no LAN, Mallampati 4 airway, no stridor, elongated uvula, boggy nasal mucosa  Respiratory - equal breath sounds bilaterally, no wheezing or rales  CV - s1s2 regular rate and rhythm, no murmurs  Ext - no clubbing, no edema  Skin - no rashes  Psych - normal mood and affect   Sleep Tests:   PSG 10/09/20 >> AHI 0, SpO2 94%, 309.5 min sleep time.  MSLT 10/10/20 >> sleep in 5 of 5 naps, mean sleep latency 6 min 25 sec.  No SOREM.  Social History:  He  reports that he has never smoked. He has never used smokeless tobacco. He reports current alcohol use. He reports that he does not use drugs.  Family History:  His family history includes Asthma in his mother; Cancer in his paternal uncle; Cancer (age of onset: 69) in his  father; Heart disease in his mother.    Discussion:  He has isolated sleep paralysis likely related to intermittent nocturnal arousals.  No evidence for significant obstructive sleep apnea or narcolepsy.  His episodes seem to occur when he is sleeping on his back and when he has more sinus congestion.  Assessment/Plan:   Isolated sleep paralysis. - reassured him that we have not found another other conditions to be of concern - discussed positional therapy  Chronic rhinitis. - he will f/u with his PCP  Time Spent Involved in Patient Care on Day of Examination:  24 minutes  Follow up:  Patient Instructions  Follow up as needed if your sleep pattern gets worse   Medication List:   Allergies as of 11/25/2020      Reactions   Aspirin    Paroxetine    REACTION: tired      Medication List       Accurate as of November 25, 2020  2:28 PM. If you have any questions, ask your nurse or doctor.        cetirizine 10 MG tablet Commonly known as: ZYRTEC Take 1 tablet (10 mg total) by mouth daily as needed for allergies or rhinitis.   cetirizine-pseudoephedrine 5-120 MG tablet Commonly known as: ZYRTEC-D  Take 1 tablet by mouth 2 (two) times daily as needed for allergies or rhinitis. Use in place of Zyrtec 10 mg for bad allergies   clotrimazole-betamethasone cream Commonly known as: Lotrisone Apply 1 application topically 2 (two) times daily.   hydrOXYzine 25 MG tablet Commonly known as: ATARAX/VISTARIL Take 1-2 tablets (25-50 mg total) by mouth every 8 (eight) hours as needed for itching.   predniSONE 20 MG tablet Commonly known as: DELTASONE Take 1 tablet (20 mg total) by mouth daily with breakfast.   sildenafil 100 MG tablet Commonly known as: Viagra Take 1 tablet (100 mg total) by mouth as needed for erectile dysfunction.   triamcinolone 55 MCG/ACT Aero nasal inhaler Commonly known as: NASACORT Place 2 sprays into the nose daily.   Vitamin B-12 1000 MCG Subl Place  1 tablet (1,000 mcg total) under the tongue daily.   Vitamin D3 50 MCG (2000 UT) capsule Take 1 capsule (2,000 Units total) by mouth daily.       Signature:  Coralyn Helling, MD [facility]Critical [facility] Pager - [phone] 11/25/2020, 2:28 PM

## 2020-11-28 NOTE — Telephone Encounter (Signed)
Ok Thx 

## 2020-12-09 ENCOUNTER — Telehealth: Payer: Self-pay | Admitting: Pulmonary Disease

## 2020-12-09 NOTE — Telephone Encounter (Signed)
Well what happened was George [name] was done and approved  Auth#[id number] 06/29/20-09/27/20 . Pt states sleep center rescheduled him 2 times and the auth has expired 09/27/20 however he did not get the study done until 11/28-11/29 . I spoke to the insurance company and was told to have the pt call and speak to the benefits dept and tell them what happened and see if they will cover it Pt is aware to call ins to see if this can be done [name]

## 2020-12-09 NOTE — Telephone Encounter (Signed)
I will check ins and then call pt back [name]

## 2020-12-12 NOTE — Telephone Encounter (Signed)
I have spoken to [email or web address] and have been instructed to fax all retro info to them and I will hear back as soon as it has been approved [name]

## 2020-12-12 NOTE — Telephone Encounter (Signed)
I will work on this [employer]

## 2020-12-16 ENCOUNTER — Other Ambulatory Visit: Payer: Self-pay

## 2020-12-19 ENCOUNTER — Other Ambulatory Visit: Payer: Self-pay

## 2020-12-19 ENCOUNTER — Ambulatory Visit (INDEPENDENT_AMBULATORY_CARE_PROVIDER_SITE_OTHER): Payer: 59 | Admitting: Internal Medicine

## 2020-12-19 ENCOUNTER — Encounter: Payer: Self-pay | Admitting: Internal Medicine

## 2020-12-19 DIAGNOSIS — B351 Tinea unguium: Secondary | ICD-10-CM | POA: Diagnosis not present

## 2020-12-19 DIAGNOSIS — Z0000 Encounter for general adult medical examination without abnormal findings: Secondary | ICD-10-CM | POA: Diagnosis not present

## 2020-12-19 DIAGNOSIS — J320 Chronic maxillary sinusitis: Secondary | ICD-10-CM | POA: Diagnosis not present

## 2020-12-19 DIAGNOSIS — R0789 Other chest pain: Secondary | ICD-10-CM

## 2020-12-19 LAB — LIPID PANEL
Cholesterol: 198 mg/dL (ref 0–200)
HDL: 72.8 mg/dL (ref >39.00)
LDL Cholesterol: 112 mg/dL — ABNORMAL HIGH (ref 0–99)
NonHDL: 124.85
Total CHOL/HDL Ratio: 3
Triglycerides: 62 mg/dL (ref 0.0–149.0)
VLDL: 12.4 mg/dL (ref 0.0–40.0)

## 2020-12-19 LAB — COMPREHENSIVE METABOLIC PANEL
ALT: 22 U/L (ref 0–53)
AST: 20 U/L (ref 0–37)
Albumin: 4.5 g/dL (ref 3.5–5.2)
Alkaline Phosphatase: 55 U/L (ref 39–117)
BUN: 16 mg/dL (ref 6–23)
CO2: 29 mEq/L (ref 19–32)
Calcium: 9.5 mg/dL (ref 8.4–10.5)
Chloride: 101 mEq/L (ref 96–112)
Creatinine, Ser: 0.83 mg/dL (ref 0.40–1.50)
GFR: 97.64 mL/min (ref >60.00)
Glucose, Bld: 76 mg/dL (ref 70–99)
Potassium: 3.7 mEq/L (ref 3.5–5.1)
Sodium: 137 mEq/L (ref 135–145)
Total Bilirubin: 1.7 mg/dL — ABNORMAL HIGH (ref 0.2–1.2)
Total Protein: 7.6 g/dL (ref 6.0–8.3)

## 2020-12-19 LAB — CBC WITH DIFFERENTIAL/PLATELET
Basophils Absolute: 0 10*3/uL (ref 0.0–0.1)
Basophils Relative: 0.7 % (ref 0.0–3.0)
Eosinophils Absolute: 0.3 10*3/uL (ref 0.0–0.7)
Eosinophils Relative: 6.1 % — ABNORMAL HIGH (ref 0.0–5.0)
HCT: 39.6 % (ref 39.0–52.0)
Hemoglobin: 13.3 g/dL (ref 13.0–17.0)
Lymphocytes Relative: 38.2 % (ref 12.0–46.0)
Lymphs Abs: 1.7 10*3/uL (ref 0.7–4.0)
MCHC: 33.7 g/dL (ref 30.0–36.0)
MCV: 92.9 fl (ref 78.0–100.0)
Monocytes Absolute: 0.5 10*3/uL (ref 0.1–1.0)
Monocytes Relative: 11.2 % (ref 3.0–12.0)
Neutro Abs: 2 10*3/uL (ref 1.4–7.7)
Neutrophils Relative %: 43.8 % (ref 43.0–77.0)
Platelets: 226 10*3/uL (ref 150.0–400.0)
RBC: 4.26 Mil/uL (ref 4.22–5.81)
RDW: 13.2 % (ref 11.5–15.5)
WBC: 4.5 10*3/uL (ref 4.0–10.5)

## 2020-12-19 LAB — URINALYSIS
Bilirubin Urine: NEGATIVE
Hgb urine dipstick: NEGATIVE
Ketones, ur: NEGATIVE
Leukocytes,Ua: NEGATIVE
Nitrite: NEGATIVE
Specific Gravity, Urine: >=1.03 — AB (ref 1.000–1.030)
Total Protein, Urine: NEGATIVE
Urine Glucose: NEGATIVE
Urobilinogen, UA: 0.2 (ref 0.0–1.0)
pH: 5.5 (ref 5.0–8.0)

## 2020-12-19 LAB — TSH: TSH: 2.22 u[IU]/mL (ref 0.35–4.50)

## 2020-12-19 LAB — PSA: PSA: 0.72 ng/mL (ref 0.10–4.00)

## 2020-12-19 MED ORDER — CICLOPIROX 8 % EX SOLN: Freq: Every day | CUTANEOUS | 1 refills | Status: DC

## 2020-12-19 NOTE — Assessment & Plan Note (Addendum)
It was triggered by doing push-ups.  Symptoms resolved.  He was unable to reproduce this pain again.  Come back if recurs

## 2020-12-19 NOTE — Progress Notes (Signed)
Subjective:  Patient ID: George [name], male    DOB: Mar 19, 1966  Age: 55 y.o. MRN: [id number]  CC: Annual Exam   HPI George [name] presents for a well exam C/o chronic sinusitis, chest tightness w/pushups - pain on the ribs  Outpatient Medications Prior to Visit  Medication Sig Dispense Refill  . cetirizine (ZYRTEC) 10 MG tablet Take 1 tablet (10 mg total) by mouth daily as needed for allergies or rhinitis. 30 tablet 11  . cetirizine-pseudoephedrine (ZYRTEC-D) 5-120 MG tablet Take 1 tablet by mouth 2 (two) times daily as needed for allergies or rhinitis. Use in place of Zyrtec 10 mg for bad allergies 60 tablet 5  . Cholecalciferol (VITAMIN D3) 50 MCG (2000 UT) capsule Take 1 capsule (2,000 Units total) by mouth daily. 100 capsule 3  . clotrimazole-betamethasone (LOTRISONE) cream Apply 1 application topically 2 (two) times daily. 45 g 1  . Cyanocobalamin (VITAMIN B-12) 1000 MCG SUBL Place 1 tablet (1,000 mcg total) under the tongue daily. 100 tablet 3  . hydrOXYzine (ATARAX/VISTARIL) 25 MG tablet Take 1-2 tablets (25-50 mg total) by mouth every 8 (eight) hours as needed for itching. (Patient not taking: Reported on 12/19/2020) 60 tablet 0  . predniSONE (DELTASONE) 20 MG tablet Take 1 tablet (20 mg total) by mouth daily with breakfast. (Patient not taking: Reported on 12/19/2020) 5 tablet 0  . sildenafil (VIAGRA) 100 MG tablet Take 1 tablet (100 mg total) by mouth as needed for erectile dysfunction. 30 tablet 5  . triamcinolone (NASACORT) 55 MCG/ACT AERO nasal inhaler Place 2 sprays into the nose daily. (Patient not taking: Reported on 12/19/2020) 1 Inhaler 12   No facility-administered medications prior to visit.    ROS: Review of Systems  Constitutional: Negative for appetite change, fatigue and unexpected weight change.  HENT: Positive for congestion, postnasal drip and rhinorrhea. Negative for nosebleeds, sneezing, sore throat and trouble swallowing.   Eyes: Negative for itching and  visual disturbance.  Respiratory: Positive for chest tightness. Negative for cough.   Cardiovascular: Negative for chest pain, palpitations and leg swelling.  Gastrointestinal: Negative for abdominal distention, blood in stool, diarrhea and nausea.  Genitourinary: Negative for frequency and hematuria.  Musculoskeletal: Negative for back pain, gait problem, joint swelling and neck pain.  Skin: Negative for rash.  Neurological: Negative for dizziness, tremors, speech difficulty and weakness.  Psychiatric/Behavioral: Negative for agitation, dysphoric mood and sleep disturbance. The patient is not nervous/anxious.    R 5th toenail w/fungus of the toenail Objective:  BP 110/82 (BP Location: Left Arm)   Pulse 81   Temp 98 F (36.7 C) (Oral)   Ht 5\' 7"  (1.702 m)   Wt 146 lb (66.2 kg)   SpO2 95%   BMI 22.87 kg/m   BP Readings from Last 3 Encounters:  12/19/20 110/82  11/25/20 100/70  07/04/20 102/62    Wt Readings from Last 3 Encounters:  12/19/20 146 lb (66.2 kg)  11/25/20 145 lb 6.4 oz (66 kg)  10/10/20 145 lb (65.8 kg)    Physical Exam Constitutional:      General: He is not in acute distress.    Appearance: He is well-developed.     Comments: NAD  HENT:     Nose: Congestion and rhinorrhea present.     Mouth/Throat:     Mouth: Oropharynx is clear and moist.  Eyes:     Conjunctiva/sclera: Conjunctivae normal.     Pupils: Pupils are equal, round, and reactive to light.  Neck:  Thyroid: No thyromegaly.     Vascular: No JVD.  Cardiovascular:     Rate and Rhythm: Normal rate and regular rhythm.     Pulses: Intact distal pulses.     Heart sounds: Normal heart sounds. No murmur heard. No friction rub. No gallop.   Pulmonary:     Effort: Pulmonary effort is normal. No respiratory distress.     Breath sounds: Normal breath sounds. No wheezing or rales.  Chest:     Chest wall: No tenderness.  Abdominal:     General: Bowel sounds are normal. There is no distension.      Palpations: Abdomen is soft. There is no mass.     Tenderness: There is no abdominal tenderness. There is no guarding or rebound.  Genitourinary:    Prostate: Normal.     Rectum: Normal. Guaiac result negative.  Musculoskeletal:        General: No tenderness or edema. Normal range of motion.     Cervical back: Normal range of motion.  Lymphadenopathy:     Cervical: No cervical adenopathy.  Skin:    General: Skin is warm and dry.     Findings: No rash.  Neurological:     Mental Status: He is alert and oriented to person, place, and time.     Cranial Nerves: No cranial nerve deficit.     Motor: No abnormal muscle tone.     Coordination: He displays a negative Romberg sign. Coordination normal.     Gait: Gait normal.     Deep Tendon Reflexes: Reflexes are normal and symmetric.  Psychiatric:        Mood and Affect: Mood and affect normal.        Behavior: Behavior normal.        Thought Content: Thought content normal.        Judgment: Judgment normal.     Lab Results  Component Value Date   WBC 3.3 (L) 11/25/2019   HGB 12.4 (L) 11/25/2019   HCT 37.8 (L) 11/25/2019   PLT 211.0 11/25/2019   GLUCOSE 88 11/25/2019   CHOL 160 11/25/2019   TRIG 48.0 11/25/2019   HDL 66.90 11/25/2019   LDLCALC 84 11/25/2019   ALT 14 11/25/2019   AST 19 11/25/2019   NA 139 11/25/2019   K 3.8 11/25/2019   CL 103 11/25/2019   CREATININE 0.76 11/25/2019   BUN 12 11/25/2019   CO2 27 11/25/2019   TSH 1.64 11/25/2019   PSA 0.51 11/25/2019   HGBA1C 4.7 05/21/2018    No results found.  Assessment & Plan:    Sonda Primes, MD

## 2020-12-19 NOTE — Assessment & Plan Note (Signed)
R 5th toe [place]

## 2020-12-19 NOTE — Assessment & Plan Note (Addendum)
Chronic sx's ENT ref Dr Ezzard Standing Sinus rinse

## 2020-12-19 NOTE — Assessment & Plan Note (Signed)
  We discussed age appropriate health related issues, including available/recomended screening tests and vaccinations. Labs were ordered to be later reviewed . All questions were answered. We discussed one or more of the following - seat belt use, use of sunscreen/sun exposure exercise, safe sex, fall risk reduction, second hand smoke exposure, firearm use and storage, seat belt use, a need for adhering to healthy diet and exercise. Labs were ordered.  All questions were answered.  Colon 2020 in [place] due 2030 Shingrix vaccine later

## 2020-12-30 ENCOUNTER — Telehealth: Payer: Self-pay | Admitting: Pulmonary Disease

## 2020-12-30 NOTE — Telephone Encounter (Signed)
Spoke with pt and will check on the auth issue and get back with him [name]

## 2021-01-05 NOTE — Telephone Encounter (Signed)
[employer] has gone back and approved the new dates pt actually went George [name]

## 2021-01-06 ENCOUNTER — Telehealth: Payer: Self-pay | Admitting: Pulmonary Disease

## 2021-01-10 NOTE — Telephone Encounter (Signed)
Spoke to pt he seems to be getting billed for his sleep study we did get a new auth to cover his sleep study done on 10/09/20&10/10/20  Auth# [id number] good for 10/09/20-10/10/20 [name]

## 2021-01-12 ENCOUNTER — Telehealth: Payer: Self-pay | Admitting: Pulmonary Disease

## 2021-01-12 NOTE — Telephone Encounter (Signed)
This is for George [name] George [name]

## 2021-01-12 NOTE — Telephone Encounter (Signed)
Sp[facility] [email or web address] and gave her the new auth# for these 2 studies she will send it to the insurance, then I called pt and made him aware he will only be responsible for $294.57 [name]

## 2021-03-31 ENCOUNTER — Other Ambulatory Visit: Payer: Self-pay | Admitting: Otolaryngology

## 2021-04-06 ENCOUNTER — Other Ambulatory Visit: Payer: Self-pay

## 2021-04-06 ENCOUNTER — Encounter (HOSPITAL_BASED_OUTPATIENT_CLINIC_OR_DEPARTMENT_OTHER): Payer: Self-pay | Admitting: Otolaryngology

## 2021-04-17 ENCOUNTER — Ambulatory Visit (HOSPITAL_BASED_OUTPATIENT_CLINIC_OR_DEPARTMENT_OTHER): Payer: 59 | Admitting: Anesthesiology

## 2021-04-17 ENCOUNTER — Encounter (HOSPITAL_BASED_OUTPATIENT_CLINIC_OR_DEPARTMENT_OTHER): Payer: Self-pay | Admitting: Otolaryngology

## 2021-04-17 ENCOUNTER — Ambulatory Visit (HOSPITAL_BASED_OUTPATIENT_CLINIC_OR_DEPARTMENT_OTHER)
Admission: RE | Admit: 2021-04-17 | Discharge: 2021-04-17 | Disposition: A | Discharge status: Home / Self Care | Payer: 59 | Source: Ambulatory Visit | Attending: Otolaryngology | Admitting: Otolaryngology

## 2021-04-17 ENCOUNTER — Other Ambulatory Visit: Payer: Self-pay

## 2021-04-17 ENCOUNTER — Encounter (HOSPITAL_BASED_OUTPATIENT_CLINIC_OR_DEPARTMENT_OTHER)
Admission: RE | Disposition: A | Discharge status: Home / Self Care | Payer: Self-pay | Source: Ambulatory Visit | Attending: Otolaryngology

## 2021-04-17 DIAGNOSIS — R430 Anosmia: Secondary | ICD-10-CM | POA: Insufficient documentation

## 2021-04-17 DIAGNOSIS — J343 Hypertrophy of nasal turbinates: Secondary | ICD-10-CM | POA: Diagnosis not present

## 2021-04-17 DIAGNOSIS — J338 Other polyp of sinus: Secondary | ICD-10-CM | POA: Diagnosis not present

## 2021-04-17 DIAGNOSIS — J3489 Other specified disorders of nose and nasal sinuses: Secondary | ICD-10-CM | POA: Insufficient documentation

## 2021-04-17 DIAGNOSIS — J324 Chronic pansinusitis: Secondary | ICD-10-CM | POA: Insufficient documentation

## 2021-04-17 HISTORY — PX: SINUS ENDO WITH FUSION: SHX5329

## 2021-04-17 HISTORY — PX: MAXILLARY ANTROSTOMY: SHX2003

## 2021-04-17 HISTORY — PX: ETHMOIDECTOMY: SHX5197

## 2021-04-17 HISTORY — PX: TURBINATE REDUCTION: SHX6157

## 2021-04-17 HISTORY — PX: FRONTAL SINUS EXPLORATION: SHX6591

## 2021-04-17 HISTORY — PX: SPHENOIDECTOMY: SHX2421

## 2021-04-17 SURGERY — REDUCTION, NASAL TURBINATE
Anesthesia: General | Site: Nose | Laterality: Bilateral

## 2021-04-17 MED ORDER — FENTANYL CITRATE (PF) 100 MCG/2ML IJ SOLN
INTRAMUSCULAR | Status: DC | PRN
  Administered 2021-04-17: 100 ug via INTRAVENOUS
  Administered 2021-04-17: 50 ug via INTRAVENOUS

## 2021-04-17 MED ORDER — OXYMETAZOLINE HCL 0.05 % NA SOLN
NASAL | Status: DC | PRN
  Administered 2021-04-17: 1 via TOPICAL

## 2021-04-17 MED ORDER — OXYCODONE HCL 5 MG/5ML PO SOLN: 5.0000 mg | Freq: Once | ORAL | Status: DC | PRN

## 2021-04-17 MED ORDER — COCAINE HCL 4 % EX SOLN
CUTANEOUS | Status: DC | PRN
  Administered 2021-04-17: 1 mL via TOPICAL

## 2021-04-17 MED ORDER — PROPOFOL 10 MG/ML IV BOLUS
INTRAVENOUS | Status: DC | PRN
  Administered 2021-04-17: 200 mg via INTRAVENOUS

## 2021-04-17 MED ORDER — OXYCODONE HCL 5 MG PO TABS: 5.0000 mg | ORAL_TABLET | Freq: Once | ORAL | Status: DC | PRN

## 2021-04-17 MED ORDER — ACETAMINOPHEN 10 MG/ML IV SOLN
1000.0000 mg | Freq: Once | INTRAVENOUS | Status: DC | PRN
  Administered 2021-04-17: 1000 mg via INTRAVENOUS

## 2021-04-17 MED ORDER — HYDROMORPHONE HCL 1 MG/ML IJ SOLN: 0.2500 mg | INTRAMUSCULAR | Status: DC | PRN

## 2021-04-17 MED ORDER — COCAINE HCL 40 MG/ML NA SOLN
NASAL | Status: AC
  Filled 2021-04-17: qty 4

## 2021-04-17 MED ORDER — LACTATED RINGERS IV SOLN: INTRAVENOUS | Status: DC

## 2021-04-17 MED ORDER — PROMETHAZINE HCL 25 MG/ML IJ SOLN
6.2500 mg | INTRAMUSCULAR | Status: DC | PRN
  Administered 2021-04-17: 6.25 mg via INTRAVENOUS

## 2021-04-17 MED ORDER — ONDANSETRON HCL 4 MG/2ML IJ SOLN
INTRAMUSCULAR | Status: DC | PRN
  Administered 2021-04-17: 4 mg via INTRAVENOUS

## 2021-04-17 MED ORDER — LIDOCAINE HCL (PF) 2 % IJ SOLN
INTRAMUSCULAR | Status: AC
  Filled 2021-04-17: qty 5

## 2021-04-17 MED ORDER — AMOXICILLIN 875 MG PO TABS: 875.0000 mg | ORAL_TABLET | Freq: Two times a day (BID) | ORAL | 0 refills | Status: AC

## 2021-04-17 MED ORDER — ROCURONIUM BROMIDE 100 MG/10ML IV SOLN
INTRAVENOUS | Status: DC | PRN
  Administered 2021-04-17: 50 mg via INTRAVENOUS

## 2021-04-17 MED ORDER — FENTANYL CITRATE (PF) 100 MCG/2ML IJ SOLN
INTRAMUSCULAR | Status: AC
  Filled 2021-04-17: qty 2

## 2021-04-17 MED ORDER — CEFAZOLIN SODIUM-DEXTROSE 2-3 GM-%(50ML) IV SOLR
INTRAVENOUS | Status: DC | PRN
  Administered 2021-04-17: 2 g via INTRAVENOUS

## 2021-04-17 MED ORDER — MIDAZOLAM HCL 2 MG/2ML IJ SOLN
INTRAMUSCULAR | Status: AC
  Filled 2021-04-17: qty 2

## 2021-04-17 MED ORDER — OXYCODONE-ACETAMINOPHEN 5-325 MG PO TABS: 1.0000 | ORAL_TABLET | ORAL | 0 refills | Status: AC | PRN

## 2021-04-17 MED ORDER — DEXAMETHASONE SODIUM PHOSPHATE 4 MG/ML IJ SOLN
INTRAMUSCULAR | Status: DC | PRN
  Administered 2021-04-17: 10 mg via INTRAVENOUS

## 2021-04-17 MED ORDER — MUPIROCIN 2 % EX OINT
TOPICAL_OINTMENT | CUTANEOUS | Status: DC | PRN
  Administered 2021-04-17: 1 via TOPICAL

## 2021-04-17 MED ORDER — PROMETHAZINE HCL 25 MG/ML IJ SOLN
INTRAMUSCULAR | Status: AC
  Filled 2021-04-17: qty 1

## 2021-04-17 MED ORDER — DEXAMETHASONE SODIUM PHOSPHATE 10 MG/ML IJ SOLN
INTRAMUSCULAR | Status: AC
  Filled 2021-04-17: qty 1

## 2021-04-17 MED ORDER — SUGAMMADEX SODIUM 200 MG/2ML IV SOLN
INTRAVENOUS | Status: DC | PRN
  Administered 2021-04-17: 250 mg via INTRAVENOUS

## 2021-04-17 MED ORDER — ACETAMINOPHEN 10 MG/ML IV SOLN
INTRAVENOUS | Status: AC
  Filled 2021-04-17: qty 100

## 2021-04-17 MED ORDER — PHENYLEPHRINE HCL (PRESSORS) 10 MG/ML IV SOLN
INTRAVENOUS | Status: DC | PRN
  Administered 2021-04-17: 200 ug via INTRAVENOUS
  Administered 2021-04-17: 80 ug via INTRAVENOUS

## 2021-04-17 MED ORDER — PHENYLEPHRINE 40 MCG/ML (10ML) SYRINGE FOR IV PUSH (FOR BLOOD PRESSURE SUPPORT)
PREFILLED_SYRINGE | INTRAVENOUS | Status: AC
  Filled 2021-04-17: qty 10

## 2021-04-17 MED ORDER — ONDANSETRON HCL 4 MG/2ML IJ SOLN
INTRAMUSCULAR | Status: AC
  Filled 2021-04-17: qty 2

## 2021-04-17 MED ORDER — MIDAZOLAM HCL 5 MG/5ML IJ SOLN
INTRAMUSCULAR | Status: DC | PRN
  Administered 2021-04-17: 2 mg via INTRAVENOUS

## 2021-04-17 MED ORDER — LIDOCAINE HCL (CARDIAC) PF 100 MG/5ML IV SOSY
PREFILLED_SYRINGE | INTRAVENOUS | Status: DC | PRN
  Administered 2021-04-17: 100 mg via INTRAVENOUS

## 2021-04-17 MED ORDER — PROPOFOL 10 MG/ML IV BOLUS
INTRAVENOUS | Status: AC
  Filled 2021-04-17: qty 20

## 2021-04-17 SURGICAL SUPPLY — 53 items
ATTRACTOMAT 16X20 MAGNETIC DRP (DRAPES) IMPLANT
BLADE RAD40 ROTATE 4M 4 5PK (BLADE) IMPLANT
BLADE RAD60 ROTATE M4 4 5PK (BLADE) IMPLANT
BLADE ROTATE RAD 12 4 M4 (BLADE) IMPLANT
BLADE ROTATE RAD 40 4 M4 (BLADE) IMPLANT
BLADE ROTATE TRICUT 4X13 M4 (BLADE) ×2 IMPLANT
BLADE TRICUT ROTATE M4 4 5PK (BLADE) IMPLANT
BUR HS RAD FRONTAL 3 (BURR) IMPLANT
CANISTER SUC SOCK COL 7IN (MISCELLANEOUS) ×3 IMPLANT
CANISTER SUCT 1200ML W/VALVE (MISCELLANEOUS) ×4 IMPLANT
COAGULATOR SUCT 8FR VV (MISCELLANEOUS) ×1 IMPLANT
COVER WAND RF STERILE (DRAPES) IMPLANT
DECANTER SPIKE VIAL GLASS SM (MISCELLANEOUS) IMPLANT
DRSG NASAL KENNEDY LMNT 8CM (GAUZE/BANDAGES/DRESSINGS) IMPLANT
DRSG NASOPORE 8CM (GAUZE/BANDAGES/DRESSINGS) ×1 IMPLANT
DRSG TELFA 3X8 NADH (GAUZE/BANDAGES/DRESSINGS) IMPLANT
ELECT REM PT RETURN 9FT ADLT (ELECTROSURGICAL) ×2
ELECTRODE REM PT RTRN 9FT ADLT (ELECTROSURGICAL) ×1 IMPLANT
GLOVE SURG ENC MOIS LTX SZ7.5 (GLOVE) ×2 IMPLANT
GLOVE SURG POLYISO LF SZ8 (GLOVE) ×1 IMPLANT
GOWN STRL REUS W/ TWL LRG LVL3 (GOWN DISPOSABLE) ×2 IMPLANT
GOWN STRL REUS W/TWL 2XL LVL3 (GOWN DISPOSABLE) ×1 IMPLANT
GOWN STRL REUS W/TWL LRG LVL3 (GOWN DISPOSABLE) ×2
HEMOSTAT SURGICEL 2X14 (HEMOSTASIS) IMPLANT
IV NS 500ML (IV SOLUTION) ×2
IV NS 500ML BAXH (IV SOLUTION) ×1 IMPLANT
NDL HYPO 25X1 1.5 SAFETY (NEEDLE) ×1 IMPLANT
NDL SPNL 25GX3.5 QUINCKE BL (NEEDLE) IMPLANT
NEEDLE HYPO 25X1 1.5 SAFETY (NEEDLE) ×2 IMPLANT
NEEDLE SPNL 25GX3.5 QUINCKE BL (NEEDLE) IMPLANT
NS IRRIG 1000ML POUR BTL (IV SOLUTION) ×2 IMPLANT
PACK BASIN DAY SURGERY FS (CUSTOM PROCEDURE TRAY) ×2 IMPLANT
PACK ENT DAY SURGERY (CUSTOM PROCEDURE TRAY) ×2 IMPLANT
PAD DRESSING TELFA 3X8 NADH (GAUZE/BANDAGES/DRESSINGS) IMPLANT
PATTIES SURGICAL .5 X3 (DISPOSABLE) IMPLANT
SLEEVE SCD COMPRESS KNEE MED (STOCKING) ×2 IMPLANT
SOLUTION BUTLER CLEAR DIP (MISCELLANEOUS) ×2 IMPLANT
SPLINT NASAL AIRWAY SILICONE (MISCELLANEOUS) IMPLANT
SPONGE GAUZE 2X2 8PLY STRL LF (GAUZE/BANDAGES/DRESSINGS) ×2 IMPLANT
SPONGE NEURO XRAY DETECT 1X3 (DISPOSABLE) ×3 IMPLANT
SUCTION FRAZIER HANDLE 10FR (MISCELLANEOUS)
SUCTION TUBE FRAZIER 10FR DISP (MISCELLANEOUS) IMPLANT
SUT CHROMIC 4 0 P 3 18 (SUTURE) IMPLANT
SUT PLAIN 4 0 ~~LOC~~ 1 (SUTURE) IMPLANT
SUT PROLENE 3 0 PS 2 (SUTURE) IMPLANT
SYR 50ML LL SCALE MARK (SYRINGE) ×1 IMPLANT
TOWEL GREEN STERILE FF (TOWEL DISPOSABLE) ×2 IMPLANT
TRACKER ENT INSTRUMENT (MISCELLANEOUS) ×2 IMPLANT
TRACKER ENT PATIENT (MISCELLANEOUS) ×2 IMPLANT
TUBE CONNECTING 20X1/4 (TUBING) ×2 IMPLANT
TUBE SALEM SUMP 16 FR W/ARV (TUBING) ×1 IMPLANT
TUBING STRAIGHTSHOT EPS 5PK (TUBING) ×2 IMPLANT
YANKAUER SUCT BULB TIP NO VENT (SUCTIONS) ×2 IMPLANT

## 2021-04-17 NOTE — Discharge Instructions (Addendum)
Next dose of Tylenol can be given at 7:00pm if needed   POSTOPERATIVE INSTRUCTIONS FOR PATIENTS HAVING NASAL OR SINUS OPERATIONS ACTIVITY: Restrict activity at home for the first two days, resting as much as possible. Light activity is best. You may usually return to work within a week. You should refrain from nose blowing, strenuous activity, or heavy lifting greater than 20lbs for a total of one week after your operation.  If sneezing cannot be avoided, sneeze with your mouth open. DISCOMFORT: You may experience a dull headache and pressure along with nasal congestion and discharge. These symptoms may be worse during the first week after the operation but may last as long as two to four weeks.  Please take Tylenol or the pain medication that has been prescribed for you. Do not take aspirin or aspirin containing medications since they may cause bleeding.  You may experience symptoms of post nasal drainage, nasal congestion, headaches and fatigue for two or three months after your operation.  BLEEDING: You may have some blood tinged nasal drainage for approximately two weeks after the operation.  The discharge will be worse for the first week.  Please call our office at [phone] or go to the nearest hospital emergency room if you experience any of the following: heavy, bright red blood from your nose or mouth that lasts longer than 15 minutes or coughing up or vomiting bright red blood or blood clots. GENERAL CONSIDERATIONS: 1. A gauze dressing will be placed on your upper lip to absorb any drainage after the operation. You may need to change this several times a day.  If you do not have very much drainage, you may remove the dressing.  Remember that you may gently wipe your nose with a tissue and sniff in, but DO NOT blow your nose. 2. Please keep all of your postoperative appointments.  Your final results after the operation will depend on proper follow-up.  The initial visit is usually 2 to 5 days  after the operation.  During this visit, the remaining nasal packing and internal septal splints will be removed.  Your nasal and sinus cavities will be cleaned.  During the second visit, your nasal and sinus cavities will be cleaned again. Have someone drive you to your first two postoperative appointments.  3. How you care for your nose after the operation will influence the results that you obtain.  You should follow all directions, take your medication as prescribed, and call our office [phone] with any problems or questions. 4. You may be more comfortable sleeping with your head elevated on two pillows. 5. Do not take any medications that we have not prescribed or recommended. WARNING SIGNS: if any of the following should occur, please call our office: 1. Persistent fever greater than 102F. 2. Persistent vomiting. 3. Severe and constant pain that is not relieved by prescribed pain medication. 4. Trauma to the nose. 5. Rash or unusual side effects from any medicines.   Post Anesthesia Home Care Instructions  Activity: Get plenty of rest for the remainder of the day. A responsible individual must stay with you for 24 hours following the procedure.  For the next 24 hours, DO NOT: -Drive a car -Advertising copywriter -Drink alcoholic beverages -Take any medication unless instructed by your physician -Make any legal decisions or sign important papers.  Meals: Start with liquid foods such as gelatin or soup. Progress to regular foods as tolerated. Avoid greasy, spicy, heavy foods. If nausea and/or vomiting occur, drink only  clear liquids until the nausea and/or vomiting subsides. Call your physician if vomiting continues.  Special Instructions/Symptoms: Your throat may feel dry or sore from the anesthesia or the breathing tube placed in your throat during surgery. If this causes discomfort, gargle with warm salt water. The discomfort should disappear within 24 hours.

## 2021-04-17 NOTE — Op Note (Signed)
DATE OF PROCEDURE: 04/17/2021  OPERATIVE REPORT   SURGEON: Newman Pies, MD   PREOPERATIVE DIAGNOSES:  1. Bilateral chronic pansinusitis and polyposis. 2. Bilateral inferior turbinate hypertrophy.  3. Chronic nasal obstruction. 4. Anosmia.  POSTOPERATIVE DIAGNOSES:  1. Bilateral chronic pansinusitis and polyposis. 2. Bilateral inferior turbinate hypertrophy.  3. Chronic nasal obstruction. 4. Anosmia.  PROCEDURE PERFORMED:  1. Bilateral endoscopic total ethmoidectomy and sphenoidotomy with polyp removal. 2. Bilateral endoscopic frontal sinusotomy with polyp removal. 3. Bilateral endoscopic maxillary antrostomy with polyp removal.   4. Bilateral partial inferior turbinate resection.  5. FUSION stereotactic image guidance.  ANESTHESIA: General endotracheal tube anesthesia.   COMPLICATIONS: None.   ESTIMATED BLOOD LOSS: 600 mL.   INDICATION FOR PROCEDURE: George [name] is a 55 y.o. male with a history of chronic rhinosinusitis, chronic nasal obstruction, and anosmia. He was previously treated with multiple courses of antibiotics and allergy medications. The patient was seen at [facility] and noted to have pansinusitis on CT. He was to be scheduled for FESS but his insurance was out of network for the surgical facility. The patient is currently using Xhance and Zyrtec daily with minimal relief.  The patient has only noted short term improvement in his symptoms with oral prednisone and antibitics in the past. Based on the above findings, the decision was made for the patient to undergo the above-stated procedures. The risks, benefits, alternatives, and details of the procedures were discussed with the patient. Questions were invited and answered. Informed consent was obtained.   DESCRIPTION OF PROCEDURE: The patient was taken to the operating room and placed supine on the operating table. General endotracheal tube anesthesia was administered by the anesthesiologist. The patient was  positioned, and prepped and draped in the standard fashion for nasal surgery. Pledgets soaked with Afrin were placed in both nasal cavities for decongestion. The pledgets were subsequently removed. The FUSION stereotactic image guidance marker was placed. The image guidance system was functional throughout the case.  The inferior one half of both hypertrophied inferior turbinate was crossclamped with a Kelly clamp. The inferior one half of each inferior turbinate was then resected with a pair of cross cutting scissors. Hemostasis was achieved with a suction cautery device.   Using a 0 endoscope, the left nasal cavity was examined. A large concha bullosa was noted. Using Tru-Cut forceps, the inferior one third and medial one half of the concha bullosa was resected. Polypoid tissue was noted within the middle meatus. The polypoid tissue was removed using a combination of microdebrider and Blakesley forceps. The uncinate process was resected with a freer elevator. The maxillary antrum was entered and enlarged using a combination of backbiter and microdebrider. Polypoid tissue was removed from the left maxillary sinus.  Attention was then focused on the ethmoid sinuses. The bony partitions of the anterior and posterior ethmoid cavities were taken down. Polypoid tissue was noted and removed.  More polyps were noted to obstruct the left sphenoid opening. The polyps were removed. The sphenoid opening was entered and enlarged. More polypoid tissue was removed from the sphenoid sinus. Attention was then focused on the frontal sinus. The frontal recess was identified and enlarged by removing the surrounding bony partitions. Polypoid tissue was removed from the frontal recess. All 4 paranasal sinuses were copiously irrigated with saline solution.  The same procedure was repeated on the right side without exception. More polyps were noted on the right side. All polyps were removed.   The care of the patient was turned  over  to the anesthesiologist. The patient was awakened from anesthesia without difficulty. The patient was extubated and transferred to the recovery room in good condition.   OPERATIVE FINDINGS: Bilateral chronic pansinusitis, polyposis, and bilateral inferior turbinate hypertrophy.   SPECIMEN: Bilateral sinus contents.  FOLLOWUP CARE: The patient be discharged home once he is awake and alert. The patient will be placed on Percocet 1 tablets p.o. q.4 hours p.r.n. pain, and amoxicillin 875 mg p.o. b.i.d. for 5 days. The patient will follow up in my office in 7 days.  George Bullinger Philomena Doheny, MD

## 2021-04-17 NOTE — Anesthesia Procedure Notes (Signed)
Procedure Name: Intubation °Performed by: Crandall Harvel M, CRNA °Pre-anesthesia Checklist: Patient identified, Emergency Drugs available, Suction available and Patient being monitored °Patient Re-evaluated:Patient Re-evaluated prior to induction °Oxygen Delivery Method: Circle system utilized °Preoxygenation: Pre-oxygenation with 100% oxygen °Induction Type: IV induction °Ventilation: Mask ventilation without difficulty °Laryngoscope Size: Mac and 4 °Grade View: Grade I °Tube type: Oral °Tube size: 7.0 mm °Number of attempts: 1 °Airway Equipment and Method: Stylet and Oral airway °Placement Confirmation: ETT inserted through vocal cords under direct vision,  positive ETCO2,  breath sounds checked- equal and bilateral and CO2 detector °Secured at: 23 cm °Tube secured with: Tape °Dental Injury: Teeth and Oropharynx as per pre-operative assessment  ° ° ° ° ° ° °

## 2021-04-17 NOTE — H&P (Signed)
Cc: Chronic rhinosinusitis  HPI: The patient is a 55 y/o male who presents today for evaluation of chronic sinus issues. The patient has noted issues with his sinuses for most of his life. He noted chronic nasal congestion, anosmia, and frequent facial pressure. He was previously treated with multiple courses of antibiotics and allergy medications. The patient was seen at [facility] and noted to have pansinusitis on CT. He was to be scheduled for FESS but his insurance was out of network for the surgical facility. The patient is currently using Xhance and Zyrtec daily with minimal relief. He was last treated with an antibiotic in January with no improvement. The patient has only noted short term improvement in his symptoms with oral prednisone in the past. Previous ENT surgery is denied.   The patient's review of systems (constitutional, eyes, ENT, cardiovascular, respiratory, GI, musculoskeletal, skin, neurologic, psychiatric, endocrine, hematologic, allergic) is noted in the ROS questionnaire.  It is reviewed with the patient.   Family health history: No HTN, DM, CAD, hearing loss or bleeding disorder.  Major events: None.  Ongoing medical problems: None.  Social history: The patient is single. He denies the use of tobacco, alcohol or illegal drugs.   Exam: General: Communicates without difficulty, well nourished, no acute distress. Head: Normocephalic, no evidence injury, no tenderness, facial buttresses intact without stepoff. Eyes: PERRL, EOMI. No scleral icterus, conjunctivae clear. Neuro: CN II exam reveals vision grossly intact.  No nystagmus at any point of gaze. Ears: Auricles well formed without lesions.  Ear canals are intact without mass or lesion.  No erythema or edema is appreciated.  The TMs are intact without fluid. Nose: External evaluation reveals normal support and skin without lesions.  Dorsum is intact.  Anterior rhinoscopy reveals congested and edematous mucosa over anterior  aspect of the inferior turbinates and nasal septum.  No purulence is noted. Middle meatus is not well visualized. Oral:  Oral cavity and oropharynx are intact, symmetric, without erythema or edema.  Mucosa is moist without lesions. Neck: Full range of motion without pain.  There is no significant lymphadenopathy.  No masses palpable.  Thyroid bed within normal limits to palpation.  Parotid glands and submandibular glands equal bilaterally without mass.  Trachea is midline. Neuro:  CN 2-12 grossly intact. Gait normal. Vestibular: No nystagmus at any point of gaze.   Assessment  1. Severe mucosal edema is noted with bilateral inferior turbinate hypertrophy, causing significant nasal obstruction.  2. CT from [facility] shows pansinusitis, centered on ethmoids bilaterally, with mucosal thickening of the maxillary, frontal and sphenoid bilaterally.   Plan  1. The physical exam and nasal endoscopy findings are reviewed with the patient. CT images are reviewed. 2. The patient will continue with his currently allergy medication regimen.  4. In light of his persistent symptoms, he will benefit from undergoing surgical intervention with bilateral endoscopic sinus surgery and bilateral turbinate reduction.  The risks, benefits, and details  of the procedures are reviewed. 5. The patient would like to proceed with the procedures.

## 2021-04-17 NOTE — Anesthesia Postprocedure Evaluation (Signed)
Anesthesia Post Note  Patient: George [name]  Procedure(s) Performed: Frederik Schmidt REDUCTION (Bilateral Nose) ENDOSCOPIC MAXILLARY ANTROSTOMY WITH TISSUE REMOVAL (Bilateral Nose) FRONTAL SINUS EXPLORATION (Bilateral Nose) TOTAL ETHMOIDECTOMY (Bilateral Nose) SPHENOIDECTOMY WITH TISSUE REMOVAL (Bilateral Nose) SINUS ENDOSCOPY WITH FUSION NAVIGATION (Bilateral Nose)     Patient location during evaluation: PACU Anesthesia Type: General Level of consciousness: awake and alert and oriented Pain management: pain level controlled Vital Signs Assessment: post-procedure vital signs reviewed and stable Respiratory status: spontaneous breathing, nonlabored ventilation and respiratory function stable Cardiovascular status: blood pressure returned to baseline and stable Postop Assessment: no apparent nausea or vomiting Anesthetic complications: no   No complications documented.  Last Vitals:  Vitals:   04/17/21 1315 04/17/21 1318  BP: 128/85 130/81  Pulse: 86 85  Resp: 13 18  Temp:    SpO2: 96% 96%    Last Pain:  Vitals:   04/17/21 1304  TempSrc:   PainSc: 8                  George [name] A.

## 2021-04-17 NOTE — Transfer of Care (Signed)
Immediate Anesthesia Transfer of Care Note  Patient: George [name]  Procedure(s) Performed: Frederik Schmidt REDUCTION (Bilateral Nose) ENDOSCOPIC MAXILLARY ANTROSTOMY WITH TISSUE REMOVAL (Bilateral Nose) FRONTAL SINUS EXPLORATION (Bilateral Nose) TOTAL ETHMOIDECTOMY (Bilateral Nose) SPHENOIDECTOMY WITH TISSUE REMOVAL (Bilateral Nose) SINUS ENDOSCOPY WITH FUSION NAVIGATION (Bilateral Nose)  Patient Location: PACU  Anesthesia Type:General  Level of Consciousness: awake, alert  and oriented  Airway & Oxygen Therapy: Patient Spontanous Breathing and Patient connected to face mask oxygen  Post-op Assessment: Report given to RN and Post -op Vital signs reviewed and stable  Post vital signs: Reviewed and stable  Last Vitals:  Vitals Value Taken Time  BP    Temp    Pulse    Resp    SpO2      Last Pain:  Vitals:   04/17/21 0826  TempSrc: Oral  PainSc: 3       Patients Stated Pain Goal: 4 (04/17/21 0826)  Complications: No complications documented.

## 2021-04-17 NOTE — Anesthesia Preprocedure Evaluation (Signed)
Anesthesia Evaluation  Patient identified by MRN, date of birth, ID band Patient awake    Reviewed: Allergy & Precautions, NPO status , Patient's Chart, lab work & pertinent test results  Airway Mallampati: II  TM Distance: >3 FB Neck ROM: Full    Dental no notable dental hx.    Pulmonary neg pulmonary ROS,    Pulmonary exam normal breath sounds clear to auscultation       Cardiovascular negative cardio ROS Normal cardiovascular exam Rhythm:Regular Rate:Normal     Neuro/Psych Anxiety negative neurological ROS  negative psych ROS   GI/Hepatic negative GI ROS, Neg liver ROS,   Endo/Other  negative endocrine ROS  Renal/GU negative Renal ROS  negative genitourinary   Musculoskeletal negative musculoskeletal ROS (+)   Abdominal   Peds negative pediatric ROS (+)  Hematology negative hematology ROS (+)   Anesthesia Other Findings   Reproductive/Obstetrics negative OB ROS                             Anesthesia Physical Anesthesia Plan  ASA: II  Anesthesia Plan: General   Post-op Pain Management:    Induction: Intravenous  PONV Risk Score and Plan: 2 and Ondansetron and Dexamethasone  Airway Management Planned: Oral ETT  Additional Equipment:   Intra-op Plan:   Post-operative Plan: Extubation in OR  Informed Consent: I have reviewed the patients History and Physical, chart, labs and discussed the procedure including the risks, benefits and alternatives for the proposed anesthesia with the patient or authorized representative who has indicated his/her understanding and acceptance.     Dental advisory given  Plan Discussed with: CRNA and Surgeon  Anesthesia Plan Comments:         Anesthesia Quick Evaluation

## 2021-04-18 ENCOUNTER — Encounter (HOSPITAL_BASED_OUTPATIENT_CLINIC_OR_DEPARTMENT_OTHER): Payer: Self-pay | Admitting: Otolaryngology

## 2021-04-19 LAB — SURGICAL PATHOLOGY

## 2021-06-28 ENCOUNTER — Telehealth: Payer: Self-pay | Admitting: Internal Medicine

## 2021-06-28 MED ORDER — CICLOPIROX 8 % EX SOLN: Freq: Every day | CUTANEOUS | 1 refills | Status: DC

## 2021-06-28 NOTE — Telephone Encounter (Signed)
1.Medication Requested: ciclopirox (PENLAC) 8 % solution  2. Pharmacy (Name, Street, [place]): [id number] - [place], [place] - [address]  Phone:  [phone] Fax:  [phone]   3. On Med List: yes  4. Last Visit with PCP: 02.07.22  5. Next visit date with PCP: n/a  **Patient prev script for med had 1 refill but he says when he went back to refill pharmacy said he couldn't**   Agent: Please be advised that RX refills may take up to 3 business days. We ask that you follow-up with your pharmacy.

## 2021-06-28 NOTE — Telephone Encounter (Signed)
[facility]ENT [facility]./LMB

## 2021-10-13 ENCOUNTER — Encounter: Payer: Self-pay | Admitting: Internal Medicine

## 2021-10-13 ENCOUNTER — Other Ambulatory Visit: Payer: Self-pay

## 2021-10-13 ENCOUNTER — Ambulatory Visit (INDEPENDENT_AMBULATORY_CARE_PROVIDER_SITE_OTHER): Payer: 59 | Admitting: Internal Medicine

## 2021-10-13 DIAGNOSIS — B001 Herpesviral vesicular dermatitis: Secondary | ICD-10-CM | POA: Diagnosis not present

## 2021-10-13 DIAGNOSIS — R599 Enlarged lymph nodes, unspecified: Secondary | ICD-10-CM | POA: Insufficient documentation

## 2021-10-13 DIAGNOSIS — Z882 Allergy status to sulfonamides status: Secondary | ICD-10-CM

## 2021-10-13 LAB — CBC WITH DIFFERENTIAL/PLATELET
Basophils Absolute: 0 10*3/uL (ref 0.0–0.1)
Basophils Relative: 0.5 % (ref 0.0–3.0)
Eosinophils Absolute: 0.2 10*3/uL (ref 0.0–0.7)
Eosinophils Relative: 4.9 % (ref 0.0–5.0)
HCT: 39.1 % (ref 39.0–52.0)
Hemoglobin: 13 g/dL (ref 13.0–17.0)
Lymphocytes Relative: 32.4 % (ref 12.0–46.0)
Lymphs Abs: 1.1 10*3/uL (ref 0.7–4.0)
MCHC: 33.2 g/dL (ref 30.0–36.0)
MCV: 93.8 fl (ref 78.0–100.0)
Monocytes Absolute: 0.4 10*3/uL (ref 0.1–1.0)
Monocytes Relative: 11.7 % (ref 3.0–12.0)
Neutro Abs: 1.8 10*3/uL (ref 1.4–7.7)
Neutrophils Relative %: 50.5 % (ref 43.0–77.0)
Platelets: 249 10*3/uL (ref 150.0–400.0)
RBC: 4.17 Mil/uL — ABNORMAL LOW (ref 4.22–5.81)
RDW: 12.9 % (ref 11.5–15.5)
WBC: 3.5 10*3/uL — ABNORMAL LOW (ref 4.0–10.5)

## 2021-10-13 MED ORDER — DOXYCYCLINE HYCLATE 100 MG PO TABS: 100.0000 mg | ORAL_TABLET | Freq: Two times a day (BID) | ORAL | 0 refills | Status: DC

## 2021-10-13 NOTE — Assessment & Plan Note (Addendum)
New.  ST, mouth sores - resolving Avoid sulfa drugs in the future

## 2021-10-13 NOTE — Assessment & Plan Note (Addendum)
Could be related to recent Sulfa allergy oral mucosa reaction  New ?sialoadenitis left greater than the right Doxy x 10 d CBC, HIV tests

## 2021-10-13 NOTE — Progress Notes (Signed)
Subjective:  Patient ID: George [name], male    DOB: [birth year]  Age: 55 y.o. MRN: [id number]  CC: Adenopathy (Swollen lymph node (L) side x's 1 week. Pt states last week his throat was sore/scratchy. Denies any other sxs)   HPI George [name] presents for swollen glands on upper neck x 2 weeks The patient is concerned about his recent allergic reaction to a sulfa drug  Outpatient Medications Prior to Visit  Medication Sig Dispense Refill   Cholecalciferol (VITAMIN D3) 50 MCG (2000 UT) capsule Take 1 capsule (2,000 Units total) by mouth daily. 100 capsule 3   ciclopirox (PENLAC) 8 % solution Apply topically at bedtime. Apply over nail and surrounding skin daily 6.6 mL 1   clotrimazole-betamethasone (LOTRISONE) cream Apply 1 application topically 2 (two) times daily. 45 g 1   Cyanocobalamin (VITAMIN B-12) 1000 MCG SUBL Place 1 tablet (1,000 mcg total) under the tongue daily. 100 tablet 3   cetirizine-pseudoephedrine (ZYRTEC-D) 5-120 MG tablet Take 1 tablet by mouth 2 (two) times daily as needed for allergies or rhinitis. Use in place of Zyrtec 10 mg for bad allergies (Patient not taking: Reported on 10/13/2021) 60 tablet 5   cetirizine (ZYRTEC) 10 MG tablet Take 1 tablet (10 mg total) by mouth daily as needed for allergies or rhinitis. (Patient not taking: Reported on 10/13/2021) 30 tablet 11   No facility-administered medications prior to visit.    ROS: Review of Systems  Constitutional:  Negative for appetite change, fatigue and unexpected weight change.  HENT:  Negative for congestion, nosebleeds, postnasal drip, sneezing, sore throat, trouble swallowing and voice change.   Eyes:  Negative for itching and visual disturbance.  Respiratory:  Negative for cough.   Cardiovascular:  Negative for chest pain, palpitations and leg swelling.  Gastrointestinal:  Negative for abdominal distention, blood in stool, diarrhea and nausea.  Genitourinary:  Negative for frequency and hematuria.   Musculoskeletal:  Negative for back pain, gait problem, joint swelling and neck pain.  Skin:  Negative for rash.  Neurological:  Negative for dizziness, tremors, speech difficulty and weakness.  Hematological:  Positive for adenopathy.  Psychiatric/Behavioral:  Negative for agitation, dysphoric mood and sleep disturbance. The patient is not nervous/anxious.    Objective:  BP 108/72 (BP Location: Left Arm)   Pulse 85   Temp 98 F (36.7 C) (Oral)   SpO2 97%   BP Readings from Last 3 Encounters:  10/13/21 108/72  04/17/21 131/84  12/19/20 110/82    Wt Readings from Last 3 Encounters:  04/17/21 151 lb 0.2 oz (68.5 kg)  12/19/20 146 lb (66.2 kg)  11/25/20 145 lb 6.4 oz (66 kg)    Physical Exam Constitutional:      General: He is not in acute distress.    Appearance: He is well-developed.     Comments: NAD  Eyes:     Conjunctiva/sclera: Conjunctivae normal.     Pupils: Pupils are equal, round, and reactive to light.  Neck:     Thyroid: No thyromegaly.     Vascular: No JVD.  Cardiovascular:     Rate and Rhythm: Normal rate and regular rhythm.     Heart sounds: Normal heart sounds. No murmur heard.   No friction rub. No gallop.  Pulmonary:     Effort: Pulmonary effort is normal. No respiratory distress.     Breath sounds: Normal breath sounds. No wheezing or rales.  Chest:     Chest wall: No tenderness.  Abdominal:  General: Bowel sounds are normal. There is no distension.     Palpations: Abdomen is soft. There is no mass.     Tenderness: There is no abdominal tenderness. There is no guarding or rebound.  Musculoskeletal:        General: No tenderness. Normal range of motion.     Cervical back: Normal range of motion.  Lymphadenopathy:     Cervical: No cervical adenopathy.  Skin:    General: Skin is warm and dry.     Findings: No rash.  Neurological:     Mental Status: He is alert and oriented to person, place, and time.     Cranial Nerves: No cranial nerve  deficit.     Motor: No abnormal muscle tone.     Coordination: Coordination normal.     Gait: Gait normal.     Deep Tendon Reflexes: Reflexes are normal and symmetric.  Psychiatric:        Behavior: Behavior normal.        Thought Content: Thought content normal.        Judgment: Judgment normal.   L>R submandibular glands are swollen, nontender No other adenopathy Lab Results  Component Value Date   WBC 3.5 (L) 10/13/2021   HGB 13.0 10/13/2021   HCT 39.1 10/13/2021   PLT 249.0 10/13/2021   GLUCOSE 76 12/19/2020   CHOL 198 12/19/2020   TRIG 62.0 12/19/2020   HDL 72.80 12/19/2020   LDLCALC 112 (H) 12/19/2020   ALT 22 12/19/2020   AST 20 12/19/2020   NA 137 12/19/2020   K 3.7 12/19/2020   CL 101 12/19/2020   CREATININE 0.83 12/19/2020   BUN 16 12/19/2020   CO2 29 12/19/2020   TSH 2.22 12/19/2020   PSA 0.72 12/19/2020   HGBA1C 4.7 05/21/2018    No results found.  Assessment & Plan:   Problem List Items Addressed This Visit     Adenopathy    Could be related to recent Sulfa allergy oral mucosa reaction  New ?sialoadenitis left greater than the right Doxy x 10 d CBC, HIV tests      Relevant Orders   CBC with Differential/Platelet (Completed)   HIV Antibody (routine testing w rflx)   Allergy to sulfa drugs    New.  ST, mouth sores - resolving Avoid sulfa drugs in the future      Cold sore    No recent relapse         Meds ordered this encounter  Medications   doxycycline (VIBRA-TABS) 100 MG tablet    Sig: Take 1 tablet (100 mg total) by mouth 2 (two) times daily.    Dispense:  20 tablet    Refill:  0      Follow-up: Return in about 4 weeks (around 11/10/2021).  Walker Kehr, MD

## 2021-10-15 ENCOUNTER — Encounter: Payer: Self-pay | Admitting: Internal Medicine

## 2021-10-15 NOTE — Assessment & Plan Note (Signed)
No recent relapse 

## 2021-10-16 LAB — HIV ANTIBODY (ROUTINE TESTING W REFLEX): HIV 1&2 Ab, 4th Generation: NONREACTIVE

## 2021-10-30 ENCOUNTER — Ambulatory Visit: Payer: 59 | Admitting: Internal Medicine

## 2021-11-15 ENCOUNTER — Other Ambulatory Visit: Payer: Self-pay

## 2021-11-15 ENCOUNTER — Encounter: Payer: Self-pay | Admitting: Internal Medicine

## 2021-11-15 ENCOUNTER — Ambulatory Visit: Payer: 59 | Admitting: Internal Medicine

## 2021-11-15 DIAGNOSIS — L299 Pruritus, unspecified: Secondary | ICD-10-CM

## 2021-11-15 DIAGNOSIS — R599 Enlarged lymph nodes, unspecified: Secondary | ICD-10-CM

## 2021-11-15 MED ORDER — HYDROXYZINE PAMOATE 25 MG PO CAPS: 25.0000 mg | ORAL_CAPSULE | Freq: Three times a day (TID) | ORAL | 2 refills | Status: DC | PRN

## 2021-11-15 NOTE — Progress Notes (Signed)
Subjective:  Patient ID: George [name], male    DOB: July 12, 1966  Age: 56 y.o. MRN: [id number]  CC: Follow-up (4 week f/u)   HPI George [name] presents for enlarged glands C/o cyst in the R axilla  Outpatient Medications Prior to Visit  Medication Sig Dispense Refill   Cholecalciferol (VITAMIN D3) 50 MCG (2000 UT) capsule Take 1 capsule (2,000 Units total) by mouth daily. 100 capsule 3   Cyanocobalamin (VITAMIN B-12) 1000 MCG SUBL Place 1 tablet (1,000 mcg total) under the tongue daily. 100 tablet 3   ciclopirox (PENLAC) 8 % solution Apply topically at bedtime. Apply over nail and surrounding skin daily (Patient not taking: Reported on 11/15/2021) 6.6 mL 1   cetirizine-pseudoephedrine (ZYRTEC-D) 5-120 MG tablet Take 1 tablet by mouth 2 (two) times daily as needed for allergies or rhinitis. Use in place of Zyrtec 10 mg for bad allergies (Patient not taking: Reported on 10/13/2021) 60 tablet 5   doxycycline (VIBRA-TABS) 100 MG tablet Take 1 tablet (100 mg total) by mouth 2 (two) times daily. (Patient not taking: Reported on 11/15/2021) 20 tablet 0   No facility-administered medications prior to visit.    ROS: Review of Systems  Constitutional:  Negative for appetite change, fatigue and unexpected weight change.  HENT:  Negative for congestion, nosebleeds, sneezing, sore throat and trouble swallowing.   Eyes:  Negative for itching and visual disturbance.  Respiratory:  Negative for cough.   Cardiovascular:  Negative for chest pain, palpitations and leg swelling.  Gastrointestinal:  Negative for abdominal distention, blood in stool, diarrhea and nausea.  Genitourinary:  Negative for frequency and hematuria.  Musculoskeletal:  Negative for back pain, gait problem, joint swelling and neck pain.  Skin:  Negative for rash.  Neurological:  Negative for dizziness, tremors, speech difficulty and weakness.  Psychiatric/Behavioral:  Negative for agitation, dysphoric mood and sleep disturbance.  The patient is not nervous/anxious.    Objective:  BP 130/76 (BP Location: Left Arm)    Pulse 87    Temp 98.6 F (37 C) (Oral)    Ht 5\' 7"  (1.702 m)    Wt 153 lb 9.6 oz (69.7 kg)    SpO2 95%    BMI 24.06 kg/m   BP Readings from Last 3 Encounters:  11/15/21 130/76  10/13/21 108/72  04/17/21 131/84    Wt Readings from Last 3 Encounters:  11/15/21 153 lb 9.6 oz (69.7 kg)  04/17/21 151 lb 0.2 oz (68.5 kg)  12/19/20 146 lb (66.2 kg)    Physical Exam Constitutional:      General: He is not in acute distress.    Appearance: He is well-developed.     Comments: NAD  Eyes:     Conjunctiva/sclera: Conjunctivae normal.     Pupils: Pupils are equal, round, and reactive to light.  Neck:     Thyroid: No thyromegaly.     Vascular: No JVD.  Cardiovascular:     Rate and Rhythm: Normal rate and regular rhythm.     Heart sounds: Normal heart sounds. No murmur heard.   No friction rub. No gallop.  Pulmonary:     Effort: Pulmonary effort is normal. No respiratory distress.     Breath sounds: Normal breath sounds. No wheezing or rales.  Chest:     Chest wall: No tenderness.  Abdominal:     General: Bowel sounds are normal. There is no distension.     Palpations: Abdomen is soft. There is no mass.     Tenderness:  There is no abdominal tenderness. There is no guarding or rebound.  Musculoskeletal:        General: No tenderness. Normal range of motion.     Cervical back: Normal range of motion.  Lymphadenopathy:     Cervical: No cervical adenopathy.  Skin:    General: Skin is warm and dry.     Findings: No rash.  Neurological:     Mental Status: He is alert and oriented to person, place, and time.     Cranial Nerves: No cranial nerve deficit.     Motor: No abnormal muscle tone.     Coordination: Coordination normal.     Gait: Gait normal.     Deep Tendon Reflexes: Reflexes are normal and symmetric.  Psychiatric:        Behavior: Behavior normal.        Thought Content: Thought  content normal.        Judgment: Judgment normal.    Lab Results  Component Value Date   WBC 3.5 (L) 10/13/2021   HGB 13.0 10/13/2021   HCT 39.1 10/13/2021   PLT 249.0 10/13/2021   GLUCOSE 76 12/19/2020   CHOL 198 12/19/2020   TRIG 62.0 12/19/2020   HDL 72.80 12/19/2020   LDLCALC 112 (H) 12/19/2020   ALT 22 12/19/2020   AST 20 12/19/2020   NA 137 12/19/2020   K 3.7 12/19/2020   CL 101 12/19/2020   CREATININE 0.83 12/19/2020   BUN 16 12/19/2020   CO2 29 12/19/2020   TSH 2.22 12/19/2020   PSA 0.72 12/19/2020   HGBA1C 4.7 05/21/2018    No results found.  Assessment & Plan:   Problem List Items Addressed This Visit     Adenopathy    Resolved      Pruritus    Dry skin - discussed Hydroxyzine prn         Meds ordered this encounter  Medications   hydrOXYzine (VISTARIL) 25 MG capsule    Sig: Take 1 capsule (25 mg total) by mouth every 8 (eight) hours as needed for itching.    Dispense:  60 capsule    Refill:  2      Follow-up: No follow-ups on file.  Walker Kehr, MD

## 2021-11-15 NOTE — Assessment & Plan Note (Signed)
Dry skin - discussed Hydroxyzine prn

## 2021-11-15 NOTE — Assessment & Plan Note (Signed)
Resolved

## 2021-11-15 NOTE — Assessment & Plan Note (Signed)
prurit

## 2022-04-17 ENCOUNTER — Ambulatory Visit (INDEPENDENT_AMBULATORY_CARE_PROVIDER_SITE_OTHER): Payer: 59 | Admitting: Internal Medicine

## 2022-04-17 ENCOUNTER — Encounter: Payer: Self-pay | Admitting: Internal Medicine

## 2022-04-17 VITALS — BP 128/76 | HR 75 | Temp 98.1°F | Ht 67.0 in | Wt 146.0 lb

## 2022-04-17 DIAGNOSIS — R42 Dizziness and giddiness: Secondary | ICD-10-CM | POA: Diagnosis not present

## 2022-04-17 DIAGNOSIS — E785 Hyperlipidemia, unspecified: Secondary | ICD-10-CM | POA: Diagnosis not present

## 2022-04-17 NOTE — Assessment & Plan Note (Signed)
Very mild - feeling as though he'll pass out w/ weakness after doing push -ups in the morning. He did 50 pushups (can do 60 max)

## 2022-04-17 NOTE — Progress Notes (Signed)
Subjective:  Patient ID: George [name], male    DOB: [birth year]  Age: 56 y.o. MRN: [id number]  CC: feeling as though he'll pass out (With weakness)   HPI George [name] presents for feeling as though he'll pass out w/ weakness after doing push -ups in the morning. He did 50 pushups (can do 60 max)  Outpatient Medications Prior to Visit  Medication Sig Dispense Refill   Cholecalciferol (VITAMIN D3) 50 MCG (2000 UT) capsule Take 1 capsule (2,000 Units total) by mouth daily. 100 capsule 3   ciclopirox (PENLAC) 8 % solution Apply topically at bedtime. Apply over nail and surrounding skin daily 6.6 mL 1   Cyanocobalamin (VITAMIN B-12) 1000 MCG SUBL Place 1 tablet (1,000 mcg total) under the tongue daily. 100 tablet 3   hydrOXYzine (VISTARIL) 25 MG capsule Take 1 capsule (25 mg total) by mouth every 8 (eight) hours as needed for itching. 60 capsule 2   No facility-administered medications prior to visit.    ROS: Review of Systems  Constitutional:  Negative for appetite change, fatigue and unexpected weight change.  HENT:  Negative for congestion, nosebleeds, sneezing, sore throat and trouble swallowing.   Eyes:  Negative for itching and visual disturbance.  Respiratory:  Negative for cough.   Cardiovascular:  Negative for chest pain, palpitations and leg swelling.  Gastrointestinal:  Negative for abdominal distention, blood in stool, diarrhea and nausea.  Genitourinary:  Negative for frequency and hematuria.  Musculoskeletal:  Negative for back pain, gait problem, joint swelling and neck pain.  Skin:  Negative for rash.  Neurological:  Negative for dizziness, tremors, speech difficulty and weakness.  Psychiatric/Behavioral:  Negative for agitation, dysphoric mood, sleep disturbance and suicidal ideas. The patient is not nervous/anxious.     Objective:  BP 128/76 (BP Location: Left Arm, Patient Position: Sitting, Cuff Size: Large)   Pulse 75   Temp 98.1 F (36.7 C) (Oral)   Ht 5'  7" (1.702 m)   Wt 146 lb (66.2 kg)   SpO2 98%   BMI 22.87 kg/m   BP Readings from Last 3 Encounters:  04/17/22 128/76  11/15/21 130/76  10/13/21 108/72    Wt Readings from Last 3 Encounters:  04/17/22 146 lb (66.2 kg)  11/15/21 153 lb 9.6 oz (69.7 kg)  04/17/21 151 lb 0.2 oz (68.5 kg)    Physical Exam Constitutional:      General: He is not in acute distress.    Appearance: Normal appearance. He is well-developed.     Comments: NAD  Eyes:     Conjunctiva/sclera: Conjunctivae normal.     Pupils: Pupils are equal, round, and reactive to light.  Neck:     Thyroid: No thyromegaly.     Vascular: No JVD.  Cardiovascular:     Rate and Rhythm: Normal rate and regular rhythm.     Heart sounds: Normal heart sounds. No murmur heard.    No friction rub. No gallop.  Pulmonary:     Effort: Pulmonary effort is normal. No respiratory distress.     Breath sounds: Normal breath sounds. No wheezing or rales.  Chest:     Chest wall: No tenderness.  Abdominal:     General: Bowel sounds are normal. There is no distension.     Palpations: Abdomen is soft. There is no mass.     Tenderness: There is no abdominal tenderness. There is no guarding or rebound.  Musculoskeletal:        General: No tenderness. Normal range of  motion.     Cervical back: Normal range of motion.  Lymphadenopathy:     Cervical: No cervical adenopathy.  Skin:    General: Skin is warm and dry.     Findings: No rash.  Neurological:     Mental Status: He is alert and oriented to person, place, and time.     Cranial Nerves: No cranial nerve deficit.     Motor: No abnormal muscle tone.     Coordination: Coordination normal.     Gait: Gait normal.     Deep Tendon Reflexes: Reflexes are normal and symmetric.  Psychiatric:        Behavior: Behavior normal.        Thought Content: Thought content normal.        Judgment: Judgment normal.      A total time of 30 minutes was spent preparing to see the patient,  reviewing tests, x-rays, operative reports and other medical records.  Also, obtaining history and performing comprehensive physical exam.  Additionally, counseling the patient regarding the above listed issues.   Finally, documenting clinical information in the health records, coordination of care, educating the patient regarding dizziness and lightheadedness.  Lab Results  Component Value Date   WBC 3.5 (L) 10/13/2021   HGB 13.0 10/13/2021   HCT 39.1 10/13/2021   PLT 249.0 10/13/2021   GLUCOSE 76 12/19/2020   CHOL 198 12/19/2020   TRIG 62.0 12/19/2020   HDL 72.80 12/19/2020   LDLCALC 112 (H) 12/19/2020   ALT 22 12/19/2020   AST 20 12/19/2020   NA 137 12/19/2020   K 3.7 12/19/2020   CL 101 12/19/2020   CREATININE 0.83 12/19/2020   BUN 16 12/19/2020   CO2 29 12/19/2020   TSH 2.22 12/19/2020   PSA 0.72 12/19/2020   HGBA1C 4.7 05/21/2018    No results found.  Assessment & Plan:   Problem List Items Addressed This Visit     Episodic lightheadedness    Very mild - feeling as though he'll pass out w/ weakness after doing push -ups in the morning. He did 50 pushups (can do 60 max)      Other Visit Diagnoses     Dyslipidemia    -  Primary   Relevant Orders   CT CARDIAC SCORING (SELF PAY ONLY)         No orders of the defined types were placed in this encounter.     Follow-up: Return in about 4 months (around 08/17/2022) for a follow-up visit.  Walker Kehr, MD

## 2022-04-17 NOTE — Patient Instructions (Signed)

## 2022-05-10 ENCOUNTER — Ambulatory Visit (HOSPITAL_BASED_OUTPATIENT_CLINIC_OR_DEPARTMENT_OTHER): Payer: 59

## 2022-06-14 ENCOUNTER — Encounter: Payer: Self-pay | Admitting: Internal Medicine

## 2022-06-14 ENCOUNTER — Ambulatory Visit: Payer: Commercial Managed Care - HMO | Admitting: Internal Medicine

## 2022-06-14 DIAGNOSIS — E538 Deficiency of other specified B group vitamins: Secondary | ICD-10-CM

## 2022-06-14 DIAGNOSIS — M25512 Pain in left shoulder: Secondary | ICD-10-CM

## 2022-06-14 DIAGNOSIS — N401 Enlarged prostate with lower urinary tract symptoms: Secondary | ICD-10-CM | POA: Diagnosis not present

## 2022-06-14 DIAGNOSIS — N400 Enlarged prostate without lower urinary tract symptoms: Secondary | ICD-10-CM | POA: Insufficient documentation

## 2022-06-14 DIAGNOSIS — E291 Testicular hypofunction: Secondary | ICD-10-CM

## 2022-06-14 DIAGNOSIS — M25519 Pain in unspecified shoulder: Secondary | ICD-10-CM | POA: Insufficient documentation

## 2022-06-14 DIAGNOSIS — G8929 Other chronic pain: Secondary | ICD-10-CM

## 2022-06-14 DIAGNOSIS — N528 Other male erectile dysfunction: Secondary | ICD-10-CM

## 2022-06-14 DIAGNOSIS — R350 Frequency of micturition: Secondary | ICD-10-CM

## 2022-06-14 LAB — COMPREHENSIVE METABOLIC PANEL
ALT: 11 U/L (ref 0–53)
AST: 17 U/L (ref 0–37)
Albumin: 4.4 g/dL (ref 3.5–5.2)
Alkaline Phosphatase: 74 U/L (ref 39–117)
BUN: 12 mg/dL (ref 6–23)
CO2: 29 mEq/L (ref 19–32)
Calcium: 9.1 mg/dL (ref 8.4–10.5)
Chloride: 101 mEq/L (ref 96–112)
Creatinine, Ser: 0.88 mg/dL (ref 0.40–1.50)
GFR: 94.93 mL/min (ref >60.00)
Glucose, Bld: 84 mg/dL (ref 70–99)
Potassium: 3.5 mEq/L (ref 3.5–5.1)
Sodium: 138 mEq/L (ref 135–145)
Total Bilirubin: 1.5 mg/dL — ABNORMAL HIGH (ref 0.2–1.2)
Total Protein: 7.4 g/dL (ref 6.0–8.3)

## 2022-06-14 LAB — TSH: TSH: 1.74 u[IU]/mL (ref 0.35–5.50)

## 2022-06-14 LAB — TESTOSTERONE: Testosterone: 298.69 ng/dL — ABNORMAL LOW (ref 300.00–890.00)

## 2022-06-14 MED ORDER — TADALAFIL 5 MG PO TABS: 5.0000 mg | ORAL_TABLET | Freq: Every day | ORAL | 11 refills | Status: DC

## 2022-06-14 MED ORDER — TRIAMCINOLONE ACETONIDE 0.1 % EX CREA: 1.0000 | TOPICAL_CREAM | Freq: Two times a day (BID) | CUTANEOUS | 3 refills | Status: DC

## 2022-06-14 NOTE — Assessment & Plan Note (Signed)
Worse Cialis daily Urol ref

## 2022-06-14 NOTE — Patient Instructions (Signed)
Blue-Emu cream -- use 2-3 times a day ? ?

## 2022-06-14 NOTE — Progress Notes (Signed)
Subjective:  Patient ID: George [name], male    DOB: [birth year]  Age: 56 y.o. MRN: [id number]  CC: No chief complaint on file.   HPI George [name] presents for ED - worse, BPH sx   Outpatient Medications Prior to Visit  Medication Sig Dispense Refill   Cholecalciferol (VITAMIN D3) 50 MCG (2000 UT) capsule Take 1 capsule (2,000 Units total) by mouth daily. 100 capsule 3   ciclopirox (PENLAC) 8 % solution Apply topically at bedtime. Apply over nail and surrounding skin daily 6.6 mL 1   Cyanocobalamin (VITAMIN B-12) 1000 MCG SUBL Place 1 tablet (1,000 mcg total) under the tongue daily. 100 tablet 3   hydrOXYzine (VISTARIL) 25 MG capsule Take 1 capsule (25 mg total) by mouth every 8 (eight) hours as needed for itching. 60 capsule 2   No facility-administered medications prior to visit.    ROS: Review of Systems  Constitutional:  Negative for appetite change, fatigue and unexpected weight change.  HENT:  Negative for congestion, nosebleeds, sneezing, sore throat and trouble swallowing.   Eyes:  Negative for itching and visual disturbance.  Respiratory:  Negative for cough.   Cardiovascular:  Negative for chest pain, palpitations and leg swelling.  Gastrointestinal:  Negative for abdominal distention, blood in stool, diarrhea and nausea.  Genitourinary:  Negative for frequency and hematuria.  Musculoskeletal:  Negative for back pain, gait problem, joint swelling and neck pain.  Skin:  Negative for rash.  Neurological:  Negative for dizziness, tremors, speech difficulty and weakness.  Psychiatric/Behavioral:  Negative for agitation, dysphoric mood, sleep disturbance and suicidal ideas. The patient is not nervous/anxious.     Objective:  BP 100/70 (BP Location: Left Arm, Patient Position: Sitting, Cuff Size: Normal)   Pulse 75   Temp 98.6 F (37 C) (Oral)   Ht 5\' 7"  (1.702 m)   Wt 141 lb (64 kg)   SpO2 90%   BMI 22.08 kg/m   BP Readings from Last 3 Encounters:  06/14/22  100/70  04/17/22 128/76  11/15/21 130/76    Wt Readings from Last 3 Encounters:  06/14/22 141 lb (64 kg)  04/17/22 146 lb (66.2 kg)  11/15/21 153 lb 9.6 oz (69.7 kg)    Physical Exam Constitutional:      General: He is not in acute distress.    Appearance: He is well-developed. He is not diaphoretic.     Comments: NAD  HENT:     Head: Normocephalic and atraumatic.     Right Ear: External ear normal.     Left Ear: External ear normal.     Nose: Nose normal.     Mouth/Throat:     Pharynx: No oropharyngeal exudate.  Eyes:     General: No scleral icterus.       Right eye: No discharge.        Left eye: No discharge.     Conjunctiva/sclera: Conjunctivae normal.     Pupils: Pupils are equal, round, and reactive to light.  Neck:     Thyroid: No thyromegaly.     Vascular: No JVD.     Trachea: No tracheal deviation.  Cardiovascular:     Rate and Rhythm: Normal rate and regular rhythm.     Heart sounds: Normal heart sounds. No murmur heard.    No friction rub. No gallop.  Pulmonary:     Effort: Pulmonary effort is normal. No respiratory distress.     Breath sounds: Normal breath sounds. No stridor. No wheezing or rales.  Chest:     Chest wall: No tenderness.  Abdominal:     General: Bowel sounds are normal. There is no distension.     Palpations: Abdomen is soft. There is no mass.     Tenderness: There is no abdominal tenderness. There is no guarding or rebound.  Genitourinary:    Penis: Normal. No tenderness.      Prostate: Normal.     Rectum: Normal. Guaiac result negative.  Musculoskeletal:        General: No tenderness. Normal range of motion.     Cervical back: Normal range of motion and neck supple.  Lymphadenopathy:     Cervical: No cervical adenopathy.  Skin:    General: Skin is warm and dry.     Coloration: Skin is not pale.     Findings: No erythema or rash.  Neurological:     Mental Status: He is alert and oriented to person, place, and time.     Cranial  Nerves: No cranial nerve deficit.     Motor: No abnormal muscle tone.     Coordination: Coordination normal.     Gait: Gait normal.     Deep Tendon Reflexes: Reflexes are normal and symmetric. Reflexes normal.  Psychiatric:        Behavior: Behavior normal.        Thought Content: Thought content normal.        Judgment: Judgment normal.     Lab Results  Component Value Date   WBC 3.5 (L) 10/13/2021   HGB 13.0 10/13/2021   HCT 39.1 10/13/2021   PLT 249.0 10/13/2021   GLUCOSE 84 06/14/2022   CHOL 198 12/19/2020   TRIG 62.0 12/19/2020   HDL 72.80 12/19/2020   LDLCALC 112 (H) 12/19/2020   ALT 11 06/14/2022   AST 17 06/14/2022   NA 138 06/14/2022   K 3.5 06/14/2022   CL 101 06/14/2022   CREATININE 0.88 06/14/2022   BUN 12 06/14/2022   CO2 29 06/14/2022   TSH 1.74 06/14/2022   PSA 0.72 12/19/2020   HGBA1C 4.7 05/21/2018    No results found.  Assessment & Plan:   Problem List Items Addressed This Visit     B12 deficiency    On B12      BPH (benign prostatic hyperplasia)    Start Cialis daily      Relevant Medications   tadalafil (CIALIS) 5 MG tablet   Other Relevant Orders   Ambulatory referral to Urology   Erectile dysfunction    Worse Cialis daily Urol ref      Relevant Orders   Ambulatory referral to Urology   Testosterone (Completed)   TSH (Completed)   Comprehensive metabolic panel (Completed)   Hypogonadism in male    New We will repeat testosterone, FSH, LH      Relevant Orders   Testosterone   Follicle stimulating hormone   Luteinizing hormone   Shoulder pain    Blue-Emu cream was recommended to use 2-3 times a day          Meds ordered this encounter  Medications   tadalafil (CIALIS) 5 MG tablet    Sig: Take 1 tablet (5 mg total) by mouth daily.    Dispense:  30 tablet    Refill:  11   triamcinolone cream (KENALOG) 0.1 %    Sig: Apply 1 Application topically 2 (two) times daily.    Dispense:  450 g    Refill:  3  Follow-up: Return in about 3 months (around 09/14/2022).  Sonda Primes, MD

## 2022-06-14 NOTE — Assessment & Plan Note (Signed)
Start Cialis daily

## 2022-06-14 NOTE — Assessment & Plan Note (Signed)
Blue-Emu cream was recommended to use 2-3 times a day ? ?

## 2022-06-14 NOTE — Assessment & Plan Note (Signed)
On B12 

## 2022-06-15 DIAGNOSIS — E291 Testicular hypofunction: Secondary | ICD-10-CM | POA: Insufficient documentation

## 2022-06-15 NOTE — Assessment & Plan Note (Signed)
New We will repeat testosterone, FSH, LH

## 2022-06-18 ENCOUNTER — Telehealth: Payer: Self-pay | Admitting: Internal Medicine

## 2022-06-18 NOTE — Telephone Encounter (Signed)
Pt is requesting a callback to discuss his lab results. He is concerned about his testosterone level.  Pt is also requesting a prior authorization for tadalafil (CIALIS) 5 MG tablet.   Please advise

## 2022-06-20 NOTE — Telephone Encounter (Signed)
Pt was on cover-my-meds need PA for Cialis. Submitted PA  w/  (Key: [id number]). Rec'd msg med has been APPROVED. Effective 06/20/2022 -  End Date:06/20/2023; Faxed approval to [employer].Marland [email or web address] Chute

## 2022-09-20 ENCOUNTER — Ambulatory Visit: Payer: Commercial Managed Care - HMO | Admitting: Internal Medicine

## 2022-09-20 ENCOUNTER — Encounter: Payer: Self-pay | Admitting: Internal Medicine

## 2022-09-20 VITALS — BP 118/70 | HR 65 | Temp 98.5°F | Ht 67.0 in | Wt 142.4 lb

## 2022-09-20 DIAGNOSIS — R21 Rash and other nonspecific skin eruption: Secondary | ICD-10-CM

## 2022-09-20 DIAGNOSIS — J320 Chronic maxillary sinusitis: Secondary | ICD-10-CM

## 2022-09-20 DIAGNOSIS — E538 Deficiency of other specified B group vitamins: Secondary | ICD-10-CM | POA: Diagnosis not present

## 2022-09-20 DIAGNOSIS — N528 Other male erectile dysfunction: Secondary | ICD-10-CM

## 2022-09-20 MED ORDER — TRIAMCINOLONE ACETONIDE 0.1 % EX OINT: 1.0000 | TOPICAL_OINTMENT | Freq: Two times a day (BID) | CUTANEOUS | 2 refills | Status: DC

## 2022-09-20 MED ORDER — CLOTRIMAZOLE-BETAMETHASONE 1-0.05 % EX CREA: 1.0000 | TOPICAL_CREAM | Freq: Every day | CUTANEOUS | 3 refills | Status: AC

## 2022-09-20 MED ORDER — AZITHROMYCIN 250 MG PO TABS: ORAL_TABLET | ORAL | 0 refills | Status: DC

## 2022-09-20 NOTE — Patient Instructions (Addendum)

## 2022-09-20 NOTE — Progress Notes (Signed)
Subjective:  Patient ID: George [name], male    DOB: 07/26/1966  Age: 56 y.o. MRN: [id number]  CC: Follow-up (3 MONTH F/U)   HPI Avis Mcmahill presents for sinus congestion C/o rash F/u ED, B12 deficiency  Outpatient Medications Prior to Visit  Medication Sig Dispense Refill   Cholecalciferol (VITAMIN D3) 50 MCG (2000 UT) capsule Take 1 capsule (2,000 Units total) by mouth daily. 100 capsule 3   ciclopirox (PENLAC) 8 % solution Apply topically at bedtime. Apply over nail and surrounding skin daily 6.6 mL 1   Cyanocobalamin (VITAMIN B-12) 1000 MCG SUBL Place 1 tablet (1,000 mcg total) under the tongue daily. 100 tablet 3   hydrOXYzine (VISTARIL) 25 MG capsule Take 1 capsule (25 mg total) by mouth every 8 (eight) hours as needed for itching. 60 capsule 2   tadalafil (CIALIS) 5 MG tablet Take 1 tablet (5 mg total) by mouth daily. 30 tablet 11   triamcinolone cream (KENALOG) 0.1 % Apply 1 Application topically 2 (two) times daily. 450 g 3   No facility-administered medications prior to visit.     ROS: Review of Systems  Constitutional:  Negative for appetite change, fatigue and unexpected weight change.  HENT:  Negative for congestion, nosebleeds, sneezing, sore throat and trouble swallowing.   Eyes:  Negative for itching and visual disturbance.  Respiratory:  Negative for cough.   Cardiovascular:  Negative for chest pain, palpitations and leg swelling.  Gastrointestinal:  Negative for abdominal distention, blood in stool, diarrhea and nausea.  Genitourinary:  Negative for frequency and hematuria.  Musculoskeletal:  Negative for back pain, gait problem, joint swelling and neck pain.  Skin:  Positive for rash.  Neurological:  Negative for dizziness, tremors, speech difficulty and weakness.  Psychiatric/Behavioral:  Negative for agitation, dysphoric mood and sleep disturbance. The patient is not nervous/anxious.     Objective:  BP 118/70 (BP Location: Left Arm)   Pulse 65    Temp 98.5 F (36.9 C) (Oral)   Ht 5\' 7"  (1.702 m)   Wt 142 lb 6.4 oz (64.6 kg)   SpO2 99%   BMI 22.30 kg/m   BP Readings from Last 3 Encounters:  09/20/22 118/70  06/14/22 100/70  04/17/22 128/76    Wt Readings from Last 3 Encounters:  09/20/22 142 lb 6.4 oz (64.6 kg)  06/14/22 141 lb (64 kg)  04/17/22 146 lb (66.2 kg)    Physical Exam Constitutional:      General: He is not in acute distress.    Appearance: He is well-developed.     Comments: NAD  Eyes:     Conjunctiva/sclera: Conjunctivae normal.     Pupils: Pupils are equal, round, and reactive to light.  Neck:     Thyroid: No thyromegaly.     Vascular: No JVD.  Cardiovascular:     Rate and Rhythm: Normal rate and regular rhythm.     Heart sounds: Normal heart sounds. No murmur heard.    No friction rub. No gallop.  Pulmonary:     Effort: Pulmonary effort is normal. No respiratory distress.     Breath sounds: Normal breath sounds. No wheezing or rales.  Chest:     Chest wall: No tenderness.  Abdominal:     General: Bowel sounds are normal. There is no distension.     Palpations: Abdomen is soft. There is no mass.     Tenderness: There is no abdominal tenderness. There is no guarding or rebound.  Musculoskeletal:  General: No tenderness. Normal range of motion.     Cervical back: Normal range of motion.  Lymphadenopathy:     Cervical: No cervical adenopathy.  Skin:    General: Skin is warm and dry.     Findings: No rash.  Neurological:     Mental Status: He is alert and oriented to person, place, and time.     Cranial Nerves: No cranial nerve deficit.     Motor: No abnormal muscle tone.     Coordination: Coordination normal.     Gait: Gait normal.     Deep Tendon Reflexes: Reflexes are normal and symmetric.  Psychiatric:        Behavior: Behavior normal.        Thought Content: Thought content normal.        Judgment: Judgment normal.   Intertrigo Ry skin on neck  Lab Results  Component  Value Date   WBC 3.5 (L) 10/13/2021   HGB 13.0 10/13/2021   HCT 39.1 10/13/2021   PLT 249.0 10/13/2021   GLUCOSE 84 06/14/2022   CHOL 198 12/19/2020   TRIG 62.0 12/19/2020   HDL 72.80 12/19/2020   LDLCALC 112 (H) 12/19/2020   ALT 11 06/14/2022   AST 17 06/14/2022   NA 138 06/14/2022   K 3.5 06/14/2022   CL 101 06/14/2022   CREATININE 0.88 06/14/2022   BUN 12 06/14/2022   CO2 29 06/14/2022   TSH 1.74 06/14/2022   PSA 0.72 12/19/2020   HGBA1C 4.7 05/21/2018    No results found.  Assessment & Plan:   Problem List Items Addressed This Visit     Sinusitis, chronic    Prescribed Z-Pak      Relevant Medications   azithromycin (ZITHROMAX Z-PAK) 250 MG tablet   Rash in adult - Primary    Intertrigo-Lotrisone prescribed Seborrheic dermatitis on the face-triamcinolone prescribed      Erectile dysfunction    Continue on Cialis as needed      B12 deficiency    Continue on vitamin B12         Meds ordered this encounter  Medications   clotrimazole-betamethasone (LOTRISONE) cream    Sig: Apply 1 Application topically daily. For under arms and groin rash    Dispense:  90 g    Refill:  3   triamcinolone ointment (KENALOG) 0.1 %    Sig: Apply 1 Application topically 2 (two) times daily.    Dispense:  80 g    Refill:  2   azithromycin (ZITHROMAX Z-PAK) 250 MG tablet    Sig: As directed    Dispense:  6 tablet    Refill:  0      Follow-up: Return in about 4 months (around 01/19/2023) for a follow-up visit.  Sonda Primes, MD

## 2022-11-06 ENCOUNTER — Encounter: Payer: Self-pay | Admitting: Internal Medicine

## 2022-11-06 NOTE — Assessment & Plan Note (Signed)
Continue on vitamin B12 

## 2022-11-06 NOTE — Assessment & Plan Note (Signed)
Intertrigo-Lotrisone prescribed Seborrheic dermatitis on the face-triamcinolone prescribed

## 2022-11-06 NOTE — Assessment & Plan Note (Signed)
Prescribed Z-Pak

## 2022-11-06 NOTE — Assessment & Plan Note (Signed)
Continue on Cialis as needed

## 2023-01-10 ENCOUNTER — Telehealth: Payer: Self-pay | Admitting: Internal Medicine

## 2023-01-10 NOTE — Telephone Encounter (Signed)
Called pt he states he is taking the Tadilifil 5 mg for his prostate. MD told him if he have to he can take another one. He is wanting to know the max dose he can take in a day. MD out of the office pls advise..Andee Poles

## 2023-01-10 NOTE — Telephone Encounter (Signed)
Pt is requesting a call from his PCP about his tadalafil (CIALIS) RX. Would not give any other info and said he was uncomfortable discussing the situation with anyone else.   Please call:  [phone]

## 2023-01-10 NOTE — Telephone Encounter (Signed)
Notified pt w/MD response.../lmb 

## 2023-01-10 NOTE — Telephone Encounter (Signed)
Well the 5 mg dose is the usual max dose if taken every day for the prostate  However, Dr Alain Marion may have meant he could take another 5 mg on some days if he also has erectile dysfunction,   thanks

## 2023-01-21 ENCOUNTER — Ambulatory Visit: Payer: Commercial Managed Care - HMO | Admitting: Internal Medicine

## 2023-02-04 ENCOUNTER — Ambulatory Visit: Payer: Commercial Managed Care - HMO | Admitting: Internal Medicine

## 2023-02-18 ENCOUNTER — Encounter: Payer: Self-pay | Admitting: Internal Medicine

## 2023-02-18 ENCOUNTER — Ambulatory Visit (INDEPENDENT_AMBULATORY_CARE_PROVIDER_SITE_OTHER): Payer: Commercial Managed Care - HMO | Admitting: Internal Medicine

## 2023-02-18 VITALS — BP 114/72 | HR 84 | Temp 99.2°F | Ht 67.0 in | Wt 140.0 lb

## 2023-02-18 DIAGNOSIS — G4700 Insomnia, unspecified: Secondary | ICD-10-CM | POA: Diagnosis not present

## 2023-02-18 DIAGNOSIS — N528 Other male erectile dysfunction: Secondary | ICD-10-CM

## 2023-02-18 DIAGNOSIS — K649 Unspecified hemorrhoids: Secondary | ICD-10-CM

## 2023-02-18 DIAGNOSIS — F419 Anxiety disorder, unspecified: Secondary | ICD-10-CM | POA: Diagnosis not present

## 2023-02-18 DIAGNOSIS — E559 Vitamin D deficiency, unspecified: Secondary | ICD-10-CM

## 2023-02-18 NOTE — Progress Notes (Signed)
Subjective:  Patient ID: George [name], male    DOB: [birth year]  Age: 57 y.o. MRN: [id number]  CC: No chief complaint on file.   HPI George [name] presents for ED,  f/u anxiety, insomnia, stress C/o possible hemorrhoid  C/o remote stress injury - his 79 yo brother was killed when the pt was 89 yo   Pt had a sleep test in 2021 - no OSA  [name] is asking me to write him a letter in regard to his psychological problems   Outpatient Medications Prior to Visit  Medication Sig Dispense Refill   Cholecalciferol (VITAMIN D3) 50 MCG (2000 UT) capsule Take 1 capsule (2,000 Units total) by mouth daily. 100 capsule 3   clotrimazole-betamethasone (LOTRISONE) cream Apply 1 Application topically daily. For under arms and groin rash 90 g 3   Cyanocobalamin (VITAMIN B-12) 1000 MCG SUBL Place 1 tablet (1,000 mcg total) under the tongue daily. 100 tablet 3   diphenhydrAMINE (BENADRYL) 25 MG tablet Take 25 mg by mouth daily at 2 am.     tadalafil (CIALIS) 5 MG tablet Take 1 tablet (5 mg total) by mouth daily. 30 tablet 11   Vitamin D, Ergocalciferol, (DRISDOL) 1.25 MG (50000 UNIT) CAPS capsule Take 50,000 Units by mouth daily at 2 am.     ciclopirox (PENLAC) 8 % solution Apply topically at bedtime. Apply over nail and surrounding skin daily (Patient not taking: Reported on 02/18/2023) 6.6 mL 1   hydrOXYzine (VISTARIL) 25 MG capsule Take 1 capsule (25 mg total) by mouth every 8 (eight) hours as needed for itching. (Patient not taking: Reported on 02/18/2023) 60 capsule 2   triamcinolone cream (KENALOG) 0.1 % Apply 1 Application topically 2 (two) times daily. (Patient not taking: Reported on 02/18/2023) 450 g 3   triamcinolone ointment (KENALOG) 0.1 % Apply 1 Application topically 2 (two) times daily. (Patient not taking: Reported on 02/18/2023) 80 g 2   azithromycin (ZITHROMAX Z-PAK) 250 MG tablet As directed 6 tablet 0   No facility-administered medications prior to visit.    ROS: Review of Systems   Constitutional:  Negative for appetite change, fatigue and unexpected weight change.  HENT:  Negative for congestion, nosebleeds, sneezing, sore throat and trouble swallowing.   Eyes:  Negative for itching and visual disturbance.  Respiratory:  Negative for cough.   Cardiovascular:  Negative for chest pain, palpitations and leg swelling.  Gastrointestinal:  Negative for abdominal distention, blood in stool, diarrhea and nausea.  Genitourinary:  Negative for frequency and hematuria.  Musculoskeletal:  Negative for back pain, gait problem, joint swelling and neck pain.  Skin:  Negative for rash.  Neurological:  Negative for dizziness, tremors, speech difficulty and weakness.  Psychiatric/Behavioral:  Positive for dysphoric mood and sleep disturbance. Negative for agitation, self-injury and suicidal ideas. The patient is nervous/anxious.     Objective:  BP 114/72 (BP Location: Right Arm, Patient Position: Sitting, Cuff Size: Normal)   Pulse 84   Temp 99.2 F (37.3 C) (Oral)   Ht 5\' 7"  (1.702 m)   Wt 140 lb (63.5 kg)   SpO2 96%   BMI 21.93 kg/m   BP Readings from Last 3 Encounters:  02/18/23 114/72  09/20/22 118/70  06/14/22 100/70    Wt Readings from Last 3 Encounters:  02/18/23 140 lb (63.5 kg)  09/20/22 142 lb 6.4 oz (64.6 kg)  06/14/22 141 lb (64 kg)    Physical Exam Constitutional:      General: He is not in acute  distress.    Appearance: Normal appearance. He is well-developed.     Comments: NAD  Eyes:     Conjunctiva/sclera: Conjunctivae normal.     Pupils: Pupils are equal, round, and reactive to light.  Neck:     Thyroid: No thyromegaly.     Vascular: No JVD.  Cardiovascular:     Rate and Rhythm: Normal rate and regular rhythm.     Heart sounds: Normal heart sounds. No murmur heard.    No friction rub. No gallop.  Pulmonary:     Effort: Pulmonary effort is normal. No respiratory distress.     Breath sounds: Normal breath sounds. No wheezing or rales.   Chest:     Chest wall: No tenderness.  Abdominal:     General: Bowel sounds are normal. There is no distension.     Palpations: Abdomen is soft. There is no mass.     Tenderness: There is no abdominal tenderness. There is no guarding or rebound.  Musculoskeletal:        General: No tenderness. Normal range of motion.     Cervical back: Normal range of motion.  Lymphadenopathy:     Cervical: No cervical adenopathy.  Skin:    General: Skin is warm and dry.     Findings: No rash.  Neurological:     Mental Status: He is alert and oriented to person, place, and time.     Cranial Nerves: No cranial nerve deficit.     Motor: No abnormal muscle tone.     Coordination: Coordination normal.     Gait: Gait normal.     Deep Tendon Reflexes: Reflexes are normal and symmetric.  Psychiatric:        Behavior: Behavior normal.        Thought Content: Thought content normal.        Judgment: Judgment normal.   Perianal tag - irritated and sensitive a little - 0.5 cm at 8 o'clock    A total time of 45 minutes was spent preparing to see the patient, reviewing tests, x-rays, operative reports and other medical records.  Also, obtaining history and performing comprehensive physical exam.  Additionally, counseling the patient regarding the above listed issues -chronic anxiety, sleep disorder, possible PTSD.  Finally, documenting clinical information in the health records, writing and letter.  Lab Results  Component Value Date   WBC 3.5 (L) 10/13/2021   HGB 13.0 10/13/2021   HCT 39.1 10/13/2021   PLT 249.0 10/13/2021   GLUCOSE 84 06/14/2022   CHOL 198 12/19/2020   TRIG 62.0 12/19/2020   HDL 72.80 12/19/2020   LDLCALC 112 (H) 12/19/2020   ALT 11 06/14/2022   AST 17 06/14/2022   NA 138 06/14/2022   K 3.5 06/14/2022   CL 101 06/14/2022   CREATININE 0.88 06/14/2022   BUN 12 06/14/2022   CO2 29 06/14/2022   TSH 1.74 06/14/2022   PSA 0.72 12/19/2020   HGBA1C 4.7 05/21/2018    No results  found.  Assessment & Plan:   Problem List Items Addressed This Visit       Cardiovascular and Mediastinum   Hemorrhoid     Perianal tag - irritated and sensitive a little - 0.5 cm. Wash, pat dry. Triamcinolone oint QID        Other   Anxiety disorder    Chronic w/OCD traits  16 yo brother was killed when the pt was 31 yo       Erectile dysfunction - Primary  Viagra prn d/c Cialis daily 5 mg Urol ref      INSOMNIA    Probable PTSD - 59 yo brother was killed when the pt was 57 yo  Pt had a sleep test in 2021 - no OSA      Vitamin D deficiency    On Vit D         No orders of the defined types were placed in this encounter.     Follow-up: Return in about 4 months (around 06/20/2023) for a follow-up visit.  Sonda PrimesAlex Gurnie Duris, MD

## 2023-02-18 NOTE — Assessment & Plan Note (Addendum)
Chronic w/OCD traits  59 yo brother was killed when the pt was 34 yo

## 2023-02-18 NOTE — Assessment & Plan Note (Signed)
Viagra prn d/c Cialis daily 5 mg Urol ref

## 2023-02-18 NOTE — Assessment & Plan Note (Signed)
On Vit D 

## 2023-02-18 NOTE — Assessment & Plan Note (Addendum)
  Perianal tag - irritated and sensitive a little - 0.5 cm. Wash, pat dry. Triamcinolone oint QID

## 2023-02-18 NOTE — Assessment & Plan Note (Addendum)
Probable PTSD - 59 yo brother was killed when the pt was 11 yo  Pt had a sleep test in 2021 - no OSA

## 2023-04-17 ENCOUNTER — Encounter: Payer: Self-pay | Admitting: Internal Medicine

## 2023-04-17 ENCOUNTER — Ambulatory Visit (INDEPENDENT_AMBULATORY_CARE_PROVIDER_SITE_OTHER): Payer: Commercial Managed Care - HMO | Admitting: Internal Medicine

## 2023-04-17 VITALS — BP 120/80 | HR 81 | Temp 98.2°F | Ht 67.0 in | Wt 136.0 lb

## 2023-04-17 DIAGNOSIS — A059 Bacterial foodborne intoxication, unspecified: Secondary | ICD-10-CM

## 2023-04-17 DIAGNOSIS — F4310 Post-traumatic stress disorder, unspecified: Secondary | ICD-10-CM | POA: Diagnosis not present

## 2023-04-17 DIAGNOSIS — R252 Cramp and spasm: Secondary | ICD-10-CM | POA: Diagnosis not present

## 2023-04-17 DIAGNOSIS — Z13220 Encounter for screening for lipoid disorders: Secondary | ICD-10-CM | POA: Diagnosis not present

## 2023-04-17 DIAGNOSIS — Z125 Encounter for screening for malignant neoplasm of prostate: Secondary | ICD-10-CM | POA: Diagnosis not present

## 2023-04-17 DIAGNOSIS — Z0000 Encounter for general adult medical examination without abnormal findings: Secondary | ICD-10-CM

## 2023-04-17 LAB — LIPID PANEL
Cholesterol: 175 mg/dL (ref 0–200)
HDL: 78.5 mg/dL (ref >39.00)
LDL Cholesterol: 89 mg/dL (ref 0–99)
NonHDL: 96.09
Total CHOL/HDL Ratio: 2
Triglycerides: 37 mg/dL (ref 0.0–149.0)
VLDL: 7.4 mg/dL (ref 0.0–40.0)

## 2023-04-17 LAB — CBC WITH DIFFERENTIAL/PLATELET
Basophils Absolute: 0 10*3/uL (ref 0.0–0.1)
Basophils Relative: 0.2 % (ref 0.0–3.0)
Eosinophils Absolute: 0 10*3/uL (ref 0.0–0.7)
Eosinophils Relative: 0.1 % (ref 0.0–5.0)
HCT: 41.7 % (ref 39.0–52.0)
Hemoglobin: 13.7 g/dL (ref 13.0–17.0)
Lymphocytes Relative: 9.2 % — ABNORMAL LOW (ref 12.0–46.0)
Lymphs Abs: 0.7 10*3/uL (ref 0.7–4.0)
MCHC: 32.8 g/dL (ref 30.0–36.0)
MCV: 94.4 fl (ref 78.0–100.0)
Monocytes Absolute: 0.4 10*3/uL (ref 0.1–1.0)
Monocytes Relative: 5.6 % (ref 3.0–12.0)
Neutro Abs: 6.5 10*3/uL (ref 1.4–7.7)
Neutrophils Relative %: 84.9 % — ABNORMAL HIGH (ref 43.0–77.0)
Platelets: 219 10*3/uL (ref 150.0–400.0)
RBC: 4.42 Mil/uL (ref 4.22–5.81)
RDW: 12.6 % (ref 11.5–15.5)
WBC: 7.6 10*3/uL (ref 4.0–10.5)

## 2023-04-17 LAB — CK: Total CK: 290 U/L — ABNORMAL HIGH (ref 7–232)

## 2023-04-17 LAB — URINALYSIS
Bilirubin Urine: NEGATIVE
Hgb urine dipstick: NEGATIVE
Ketones, ur: NEGATIVE
Leukocytes,Ua: NEGATIVE
Nitrite: NEGATIVE
Specific Gravity, Urine: 1.01 (ref 1.000–1.030)
Total Protein, Urine: NEGATIVE
Urine Glucose: NEGATIVE
Urobilinogen, UA: 1 (ref 0.0–1.0)
pH: 6.5 (ref 5.0–8.0)

## 2023-04-17 LAB — COMPREHENSIVE METABOLIC PANEL
ALT: 11 U/L (ref 0–53)
AST: 18 U/L (ref 0–37)
Albumin: 4.6 g/dL (ref 3.5–5.2)
Alkaline Phosphatase: 53 U/L (ref 39–117)
BUN: 11 mg/dL (ref 6–23)
CO2: 25 mEq/L (ref 19–32)
Calcium: 9.6 mg/dL (ref 8.4–10.5)
Chloride: 100 mEq/L (ref 96–112)
Creatinine, Ser: 0.7 mg/dL (ref 0.40–1.50)
GFR: 101.13 mL/min (ref >60.00)
Glucose, Bld: 91 mg/dL (ref 70–99)
Potassium: 3.9 mEq/L (ref 3.5–5.1)
Sodium: 139 mEq/L (ref 135–145)
Total Bilirubin: 2.2 mg/dL — ABNORMAL HIGH (ref 0.2–1.2)
Total Protein: 7.8 g/dL (ref 6.0–8.3)

## 2023-04-17 LAB — MAGNESIUM: Magnesium: 1.9 mg/dL (ref 1.5–2.5)

## 2023-04-17 LAB — PSA: PSA: 1.08 ng/mL (ref 0.10–4.00)

## 2023-04-17 LAB — TSH: TSH: 0.89 u[IU]/mL (ref 0.35–5.50)

## 2023-04-17 MED ORDER — CICLOPIROX 8 % EX SOLN: Freq: Every day | CUTANEOUS | 1 refills | Status: AC

## 2023-04-17 NOTE — Assessment & Plan Note (Signed)
Discussed Will ref to see a psychologis

## 2023-04-17 NOTE — Assessment & Plan Note (Signed)
Worse Check CMET Hydrate well

## 2023-04-17 NOTE — Progress Notes (Signed)
Subjective:  Patient ID: George [name], male    DOB: 04-Nov-1966  Age: 57 y.o. MRN: [id number]  CC: Dysmenorrhea (ABDOMEN CRAMPS AND LEG CRAMPS AT NIGHT)   HPI George [name] presents for nausea, vomited x10, dizzy this am He ate [place] last night; better  C/o leg cramps  F/u PTSD    Outpatient Medications Prior to Visit  Medication Sig Dispense Refill   Cholecalciferol (VITAMIN D3) 50 MCG (2000 UT) capsule Take 1 capsule (2,000 Units total) by mouth daily. 100 capsule 3   clotrimazole-betamethasone (LOTRISONE) cream Apply 1 Application topically daily. For under arms and groin rash 90 g 3   Cyanocobalamin (VITAMIN B-12) 1000 MCG SUBL Place 1 tablet (1,000 mcg total) under the tongue daily. 100 tablet 3   diphenhydrAMINE (BENADRYL) 25 MG tablet Take 25 mg by mouth daily at 2 am.     tadalafil (CIALIS) 5 MG tablet Take 1 tablet (5 mg total) by mouth daily. 30 tablet 11   Vitamin D, Ergocalciferol, (DRISDOL) 1.25 MG (50000 UNIT) CAPS capsule Take 50,000 Units by mouth daily at 2 am.     ciclopirox (PENLAC) 8 % solution Apply topically at bedtime. Apply over nail and surrounding skin daily (Patient not taking: Reported on 02/18/2023) 6.6 mL 1   hydrOXYzine (VISTARIL) 25 MG capsule Take 1 capsule (25 mg total) by mouth every 8 (eight) hours as needed for itching. (Patient not taking: Reported on 02/18/2023) 60 capsule 2   triamcinolone cream (KENALOG) 0.1 % Apply 1 Application topically 2 (two) times daily. (Patient not taking: Reported on 02/18/2023) 450 g 3   triamcinolone ointment (KENALOG) 0.1 % Apply 1 Application topically 2 (two) times daily. (Patient not taking: Reported on 02/18/2023) 80 g 2   No facility-administered medications prior to visit.    ROS: Review of Systems  Constitutional:  Negative for appetite change, fatigue and unexpected weight change.  HENT:  Negative for congestion, nosebleeds, sneezing, sore throat and trouble swallowing.   Eyes:  Negative for itching and  visual disturbance.  Respiratory:  Negative for cough.   Cardiovascular:  Negative for chest pain, palpitations and leg swelling.  Gastrointestinal:  Negative for abdominal distention, blood in stool, diarrhea and nausea.  Genitourinary:  Negative for frequency and hematuria.  Musculoskeletal:  Negative for back pain, gait problem, joint swelling and neck pain.  Skin:  Negative for rash.  Neurological:  Negative for dizziness, tremors, speech difficulty and weakness.  Psychiatric/Behavioral:  Positive for sleep disturbance. Negative for agitation and dysphoric mood. The patient is nervous/anxious.     Objective:  BP 120/80 (BP Location: Right Arm, Patient Position: Sitting, Cuff Size: Large)   Pulse 81   Temp 98.2 F (36.8 C) (Oral)   Ht 5\' 7"  (1.702 m)   Wt 136 lb (61.7 kg)   SpO2 98%   BMI 21.30 kg/m   BP Readings from Last 3 Encounters:  04/17/23 120/80  02/18/23 114/72  09/20/22 118/70    Wt Readings from Last 3 Encounters:  04/17/23 136 lb (61.7 kg)  02/18/23 140 lb (63.5 kg)  09/20/22 142 lb 6.4 oz (64.6 kg)    Physical Exam Constitutional:      General: He is not in acute distress.    Appearance: Normal appearance. He is well-developed.     Comments: NAD  Eyes:     Conjunctiva/sclera: Conjunctivae normal.     Pupils: Pupils are equal, round, and reactive to light.  Neck:     Thyroid: No thyromegaly.  Vascular: No JVD.  Cardiovascular:     Rate and Rhythm: Normal rate and regular rhythm.     Heart sounds: Normal heart sounds. No murmur heard.    No friction rub. No gallop.  Pulmonary:     Effort: Pulmonary effort is normal. No respiratory distress.     Breath sounds: Normal breath sounds. No wheezing or rales.  Chest:     Chest wall: No tenderness.  Abdominal:     General: Bowel sounds are normal. There is no distension.     Palpations: Abdomen is soft. There is no mass.     Tenderness: There is no abdominal tenderness. There is no guarding or  rebound.  Musculoskeletal:        General: No tenderness. Normal range of motion.     Cervical back: Normal range of motion.  Lymphadenopathy:     Cervical: No cervical adenopathy.  Skin:    General: Skin is warm and dry.     Findings: No rash.  Neurological:     Mental Status: He is alert and oriented to person, place, and time.     Cranial Nerves: No cranial nerve deficit.     Motor: No abnormal muscle tone.     Coordination: Coordination normal.     Gait: Gait normal.     Deep Tendon Reflexes: Reflexes are normal and symmetric.  Psychiatric:        Behavior: Behavior normal.        Thought Content: Thought content normal.        Judgment: Judgment normal.   Faint rash in B axillas  Lab Results  Component Value Date   WBC 3.5 (L) 10/13/2021   HGB 13.0 10/13/2021   HCT 39.1 10/13/2021   PLT 249.0 10/13/2021   GLUCOSE 84 06/14/2022   CHOL 198 12/19/2020   TRIG 62.0 12/19/2020   HDL 72.80 12/19/2020   LDLCALC 112 (H) 12/19/2020   ALT 11 06/14/2022   AST 17 06/14/2022   NA 138 06/14/2022   K 3.5 06/14/2022   CL 101 06/14/2022   CREATININE 0.88 06/14/2022   BUN 12 06/14/2022   CO2 29 06/14/2022   TSH 1.74 06/14/2022   PSA 0.72 12/19/2020   HGBA1C 4.7 05/21/2018    No results found.  Assessment & Plan:   Problem List Items Addressed This Visit     PTSD (post-traumatic stress disorder) - Primary    Discussed Will ref to see a psychologis      Relevant Orders   Ambulatory referral to Psychology   TSH   Urinalysis   CBC with Differential/Platelet   Lipid panel   Comprehensive metabolic panel   PSA   Magnesium   Well adult exam   Relevant Orders   TSH   Urinalysis   CBC with Differential/Platelet   Lipid panel   Comprehensive metabolic panel   PSA   Magnesium   Food poisoning    x1 day Zofran offered and declined Hydrate well      Relevant Orders   TSH   Urinalysis   CBC with Differential/Platelet   Lipid panel   Comprehensive metabolic  panel   PSA   Magnesium   Cramp in muscle    Worse Check CMET Hydrate well         Meds ordered this encounter  Medications   ciclopirox (PENLAC) 8 % solution    Sig: Apply topically at bedtime. Apply over nail and surrounding skin daily    Dispense:  6.6 mL    Refill:  1      Follow-up: No follow-ups on file.  Sonda Primes, MD

## 2023-04-17 NOTE — Assessment & Plan Note (Signed)
x1 day Zofran offered and declined Hydrate well

## 2023-05-10 ENCOUNTER — Ambulatory Visit: Payer: Commercial Managed Care - HMO | Admitting: Licensed Clinical Social Worker

## 2023-05-24 ENCOUNTER — Ambulatory Visit: Payer: BC Managed Care – PPO | Admitting: Licensed Clinical Social Worker

## 2023-05-24 DIAGNOSIS — F4310 Post-traumatic stress disorder, unspecified: Secondary | ICD-10-CM | POA: Diagnosis not present

## 2023-05-24 NOTE — Progress Notes (Unsigned)
[facility] Counselor/INTAKE and TREATMENT PLAN  Patient ID: George [name], MRN: [id number]    Date: 05/24/23  Time Spent: 0900  am - 1000 am : 60 Minutes  Treatment Type: Individual Therapy.  Reported Symptoms: PTSD including disturbed sleep, anxiety, excessive worry.  Mental Status Exam: Appearance:  Casual     Behavior: Appropriate  Motor: Normal  Speech/Language:  Clear and Coherent  Affect: {PSY:22687}  Mood: {PSY:31886}  Thought process: {PSY:31888}  Thought content:   WNL  Sensory/Perceptual disturbances:   WNL  Orientation: oriented to person, place, time/date, situation, day of week, month of year, and year  Attention: Good  Concentration: Good  Memory: WNL  Fund of knowledge:  Good  Insight:   Good  Judgment:  Good  Impulse Control: Good   Risk Assessment: Danger to Self:  No Self-injurious Behavior: No Danger to Others: No Duty to Warn:no Physical Aggression / Violence:No  Access to Firearms a concern: No  Gang Involvement:No   Subjective:   George [name] participated from community, via video, and consented to treatment. Therapist participated from home office. We met online due to patient request.  George [name] presented on time for his schedules appointment. Patient was pleasant and cooperative and engaged in completion of assessment and development of treatment plan.   resenting Problem Chief Complaint: ***  What are the main stressors in your life right now, how long? {CHL AMB BH LIFE STRESSORS:22831} ***  Previous mental health services Have you ever been treated for a mental health problem, when, where, by whom? {BHH YES OR OZ:30865}  ***   Are you currently seeing a therapist or counselor, counselor's name? {BHH YES OR NO:22294} ***  Have you ever had a mental health hospitalization, how many times, length of stay? {BHH YES OR HQ:46962} ***  Have you ever been treated with medication, name, reason, response? {BHH YES OR  NO:22294} ***  Have you ever had suicidal thoughts or attempted suicide, when, how? {BHH YES OR NO:22294} ***  Risk factors for Suicide Demographic factors:  {CHL AMB BH Suicide Demographics:21022747:a} Current mental status: {CHL AMB BH Suicide Current Mental Status:21022748:a} Loss factors: {CHL AMB BH Suicide Loss Factors:21022749:a} Historical factors: {CHL AMB BH Suicide Historical Factors:21022750:a} Risk Reduction factors: {CHL AMB BH Suicide Risk Reduction Factors:21022751:a} Clinical factors:  {Clinical Factors:22706} Cognitive features that contribute to risk: {Cognitive Features:22703}    SUICIDE RISK:  {BHH SUICIDE RISK:22704}  Medical history Medical treatment and/or problems, explain: {BHH YES OR NO:22294} *** Do you have any issues with chronic pain?  {BHH YES OR XB:28413} *** Name of primary care physician/last physical exam: ***  Allergies: {BHH YES OR NO:22294} Medication, reactions? ***   Current medications: *** Prescribed by: *** Is there any history of mental health problems or substance abuse in your family, whom? {BHH YES OR NO:22294} *** Has anyone in your family been hospitalized, who, where, length of stay? {BHH YES OR KG:40102} ***  Social/family history Have you been married, how many times?  ***  Do you have children?  ***  How many pregnancies have you had?  ***  Who lives in your current household? ***  Military history: {BHH YES OR VO:53664} ***  Religious/spiritual involvement:  What religion/faith base are you? ***  Family of origin (childhood history)  Where were you born? *** Where did you grow up? *** How many different homes have you lived? *** Describe the atmosphere of the household where you grew up: *** Do you have siblings, step/half siblings, list  names, relation, sex, age? {BHH YES OR ZO:10960} ***  Are your parents separated/divorced, when and why? {BHH YES OR AV:40981} ***  Are your parents alive? {BHH YES OR XB:14782}  ***  Social supports (personal and professional): ***  Education How many grades have you completed? {misc; education:31912} Did you have any problems in school, what type? {BHH YES OR NF:62130} *** Medications prescribed for these problems? {BHH YES OR QM:57846} ***  Employment (financial issues)   Legal history   Trauma/Abuse history: Have you ever been exposed to any form of abuse, what type? {BHH YES OR NO:22294} {Type of abuse:20566}  Have you ever been exposed to something traumatic, describe? {BHH YES OR NG:29528} ***  Substance use Do you use Caffeine? {BHH YES OR NO:22294} Type, frequency? ***  Do you use Nicotine? {BHH YES OR NO:22294} Type, frequency, ppd? ***   Do you use Alcohol? {BHH YES OR NO:22294} Type, frequency? ***  How old were you went you first tasted alcohol? *** Was this accepted by your family? {BHH YES OR NO:22294}  When was your last drink, type, how much? ***  Have you ever used illicit drugs or taken more than prescribed, type, frequency, date of last usage? {BHH YES OR UX:32440} ***  Mental Status: General Appearance /Behavior:  {BHH GENERAL APPEARANCE/BEHAVIOR:22300} Eye Contact:  {BHH EYE CONTACT:22301} Motor Behavior:  {BHH MOTOR BEHAVIOR:22302} Speech:  {BHH SPEECH:22304} Level of Consciousness:  {BHH LEVEL OF CONSCIOUSNESS:22305} Mood:  {BHH MOOD:22306} Affect:  {BHH AFFECT:22307} Anxiety Level:  {BHH ANXIETY LEVEL:22308} Thought Process:  {BHH THOUGHT PROCESS:22309} Thought Content:  {BHH THOUGHT CONTENT:22310} Perception:  {BHH PERCEPTION:22311} Judgment:  {BHH JUDGMENT:22312} Insight:  {BHH INSIGHT:22313} Cognition:  {BHH COGNITION:22314}  Diagnosis AXIS I {psych axis 1:31909}  AXIS II {psych axis 2:31910}  AXIS III @PMH @  AXIS IV {psych axis iv:31915}  AXIS V {psych axis v score:31919}   Plan: ***  _________________________________________           Magnus Sinning. Charlean Merl, [place] LPC/ Date  Interventions: Cognitive  Behavioral Therapy  Diagnosis: PTSD   Plan: ***Patient is to use CBT, mindfulness and coping skills to help manage decrease symptoms associated with their diagnosis.   Long-term goal:   ***Reduce overall level, frequency, and intensity of the feelings of depression, anxiety and panic evidenced by       decreased irritability, negative self talk, and helpless feelings from 6 to 7 days/week to 0 to 1 days/week per client report for at least 3 consecutive months.  Short-term goal:  ***Verbally express understanding of the relationship between feelings of depression, anxiety and their impact on thinking patterns and behaviors. Verbalize an understanding of the role that distorted thinking plays in creating fears, excessive worry, and ruminations.  Phyllis Ginger MSW, LCSW/DATE

## 2023-05-31 ENCOUNTER — Ambulatory Visit: Payer: BC Managed Care – PPO | Admitting: Licensed Clinical Social Worker

## 2023-05-31 DIAGNOSIS — F4310 Post-traumatic stress disorder, unspecified: Secondary | ICD-10-CM | POA: Diagnosis not present

## 2023-06-02 NOTE — Progress Notes (Signed)
[facility] Counselor/Therapist Progress Note  Patient ID: George [name], MRN: [id number]    Date:05/31/2023  Time Spent: 1105  am - 1200 pm : 55 Minutes  Treatment Type: Individual Therapy.  Reported Symptoms: anxiety, depression, poor sleep, fear of death, guilt  Mental Status Exam: Appearance:  NA     Behavior: Appropriate  Motor: NA  Speech/Language:  Clear and Coherent  Affect: NA  Mood: depressed  Thought process: normal  Thought content:   WNL  Sensory/Perceptual disturbances:   WNL  Orientation: oriented to person, place, time/date, situation, day of week, month of year, and year  Attention: Good  Concentration: Good  Memory: WNL  Fund of knowledge:  Good  Insight:   Good  Judgment:  Good  Impulse Control: Good   Risk Assessment: Danger to Self:  No Self-injurious Behavior: No Danger to Others: No Duty to Warn:no Physical Aggression / Violence:No  Access to Firearms a concern: No  Gang Involvement:No   Subjective:   [name] participated from home, via phone due to a nationwide [employer] outage. Patient was advised of risk and limitations and patient consented to treatment. Therapist participated from home office. We met via phone due to nationwide outage and inability to connect via [employer].   Clinician met with Patient via phone and explained risks an limitations. Patient engaged in discussion and identified his desire to remember certain details of his brothers murder and the days following. Patient reports that he has repressed feelings and memories in order to protect himself. Patient expressed a desire to remember these events and requested information regarding therapies to assist with this. Patient also expressed fear of how this could impact his life and future if he should remember.  Interventions: Cognitive Behavioral Therapy  Diagnosis: PTSD  Plan: Jaken is to use CBT, mindfulness and coping skills to help manage  decrease symptoms associated with their diagnosis.Therapist will provide referrals for additional resources as appropriate.  Therapist will provide psycho-education regarding anxiety and depression related to his diagnosis and corresponding treatment approaches and interventions. Licensed Clinical Social Worker, Phyllis Ginger, LCSW will support the patient's ability to achieve the goals identified. will employ CBT, BA, Problem-solving, Solution Focused, Mindfulness,  coping skills, & other evidenced-based practices will be used to promote progress towards healthy functioning to help manage decrease symptoms associated with his diagnosis.   Reduce overall level, frequency, and intensity of the feelings of depression, anxiety and panic evidenced by decreased from 6 to 7 days/week to 0 to 2 days/week per client report for at least 3 consecutive months with Treatment planbeing reviewed by  05/23/2024  Verbally express understanding of the relationship between feelings of depression, anxiety and their impact on thinking patterns and behaviors. Verbalize an understanding of the role that distorted thinking plays in creating fears, excessive worry, and ruminations.    Long-term goal:   Shooter will Reduce overall level, frequency, and intensity of the feelings of depression, anxiety and panic evidenced by decreased irritability, negative self talk, guilt and helpless feelings from 6 to 7 days/week to 0 to 2 days/week per client report for at least 3 consecutive months.  Short-term goal:  Sulo will verbally express understanding of the relationship between feelings of depression, anxiety and their impact on thinking patterns and behaviors. Verbalize an understanding of the role that distorted thinking plays in creating fears, excessive worry, and ruminations.  Phyllis Ginger MSW, LCSW DATE: 05/31/2023

## 2023-06-07 ENCOUNTER — Other Ambulatory Visit (HOSPITAL_COMMUNITY): Payer: Self-pay

## 2023-06-07 ENCOUNTER — Telehealth: Payer: Self-pay

## 2023-06-07 NOTE — Telephone Encounter (Signed)
Pharmacy Patient Advocate Encounter  Received notification from [facility] that Prior Authorization for Tadalafil 5MG  tablets has been CANCELLED due to; Your patient will pay 100% of a discounted price for this medication. Any amount the patient pays will not apply to their deductible or out-of-pocket expenses  Key #: [id number]    The patient is insured through [employer]   Ran test claim for Tadalafil 5MG  tablets. Currently a quantity of 30 is a 30 day supply and the co-pay is $181.50 .   This test claim was processed through [facility]- copay amounts may vary at other pharmacies due to pharmacy/plan contracts, or as the patient moves through the different stages of their insurance plan.

## 2023-06-10 NOTE — Telephone Encounter (Signed)
Ok Thx 

## 2023-06-12 ENCOUNTER — Other Ambulatory Visit: Payer: Self-pay | Admitting: Internal Medicine

## 2023-06-12 DIAGNOSIS — N401 Enlarged prostate with lower urinary tract symptoms: Secondary | ICD-10-CM

## 2023-06-19 ENCOUNTER — Encounter: Payer: Self-pay | Admitting: Internal Medicine

## 2023-06-19 ENCOUNTER — Ambulatory Visit: Payer: BC Managed Care – PPO | Admitting: Internal Medicine

## 2023-06-19 VITALS — BP 110/70 | HR 69 | Temp 98.6°F | Ht 67.0 in | Wt 136.0 lb

## 2023-06-19 DIAGNOSIS — E876 Hypokalemia: Secondary | ICD-10-CM

## 2023-06-19 DIAGNOSIS — E785 Hyperlipidemia, unspecified: Secondary | ICD-10-CM

## 2023-06-19 DIAGNOSIS — F4310 Post-traumatic stress disorder, unspecified: Secondary | ICD-10-CM

## 2023-06-19 DIAGNOSIS — R252 Cramp and spasm: Secondary | ICD-10-CM

## 2023-06-19 DIAGNOSIS — F5102 Adjustment insomnia: Secondary | ICD-10-CM

## 2023-06-19 DIAGNOSIS — E538 Deficiency of other specified B group vitamins: Secondary | ICD-10-CM | POA: Diagnosis not present

## 2023-06-19 MED ORDER — ZOLPIDEM TARTRATE 10 MG PO TABS: 5.0000 mg | ORAL_TABLET | Freq: Every evening | ORAL | 2 refills | Status: AC | PRN

## 2023-06-19 NOTE — Patient Instructions (Signed)
Use Magnesium oil spray for muscle cramps 

## 2023-06-19 NOTE — Assessment & Plan Note (Signed)
Pt started to see a psychologist

## 2023-06-19 NOTE — Progress Notes (Signed)
Subjective:  Patient ID: George [name], male    DOB: Sep 26, 1966  Age: 57 y.o. MRN: [id number]  CC: Follow-up (4 mnth f/u)   HPI George [name] presents for insomnia - can't sleep Problems staying asleep. Working 6 pm to 6 am now. C/o leg cramps, dyslipidemia  Outpatient Medications Prior to Visit  Medication Sig Dispense Refill   Cholecalciferol (VITAMIN D3) 50 MCG (2000 UT) capsule Take 1 capsule (2,000 Units total) by mouth daily. 100 capsule 3   ciclopirox (PENLAC) 8 % solution Apply topically at bedtime. Apply over nail and surrounding skin daily 6.6 mL 1   clotrimazole-betamethasone (LOTRISONE) cream Apply 1 Application topically daily. For under arms and groin rash 90 g 3   Cyanocobalamin (VITAMIN B-12) 1000 MCG SUBL Place 1 tablet (1,000 mcg total) under the tongue daily. 100 tablet 3   diphenhydrAMINE (BENADRYL) 25 MG tablet Take 25 mg by mouth daily at 2 am.     tadalafil (CIALIS) 5 MG tablet TAKE 1 TABLET (5 MG TOTAL) BY MOUTH DAILY. 30 tablet 5   Vitamin D, Ergocalciferol, (DRISDOL) 1.25 MG (50000 UNIT) CAPS capsule Take 50,000 Units by mouth daily at 2 am.     No facility-administered medications prior to visit.    ROS: Review of Systems  Constitutional:  Positive for fatigue. Negative for appetite change and unexpected weight change.  HENT:  Negative for congestion, nosebleeds, sneezing, sore throat and trouble swallowing.   Eyes:  Negative for itching and visual disturbance.  Respiratory:  Negative for cough.   Cardiovascular:  Negative for chest pain, palpitations and leg swelling.  Gastrointestinal:  Negative for abdominal distention, blood in stool, diarrhea and nausea.  Genitourinary:  Negative for frequency and hematuria.  Musculoskeletal:  Negative for back pain, gait problem, joint swelling and neck pain.  Skin:  Negative for rash.  Neurological:  Negative for dizziness, tremors, speech difficulty and weakness.  Psychiatric/Behavioral:  Positive for sleep  disturbance. Negative for agitation, dysphoric mood and suicidal ideas. The patient is nervous/anxious.     Objective:  BP 110/70 (BP Location: Right Arm, Patient Position: Sitting, Cuff Size: Large)   Pulse 69   Temp 98.6 F (37 C) (Oral)   Ht 5\' 7"  (1.702 m)   Wt 136 lb (61.7 kg)   SpO2 97%   BMI 21.30 kg/m   BP Readings from Last 3 Encounters:  06/19/23 110/70  04/17/23 120/80  02/18/23 114/72    Wt Readings from Last 3 Encounters:  06/19/23 136 lb (61.7 kg)  04/17/23 136 lb (61.7 kg)  02/18/23 140 lb (63.5 kg)    Physical Exam Constitutional:      General: He is not in acute distress.    Appearance: Normal appearance. He is well-developed.     Comments: NAD  Eyes:     Conjunctiva/sclera: Conjunctivae normal.     Pupils: Pupils are equal, round, and reactive to light.  Neck:     Thyroid: No thyromegaly.     Vascular: No JVD.  Cardiovascular:     Rate and Rhythm: Normal rate and regular rhythm.     Heart sounds: Normal heart sounds. No murmur heard.    No friction rub. No gallop.  Pulmonary:     Effort: Pulmonary effort is normal. No respiratory distress.     Breath sounds: Normal breath sounds. No wheezing or rales.  Chest:     Chest wall: No tenderness.  Abdominal:     General: Bowel sounds are normal. There is no distension.  Palpations: Abdomen is soft. There is no mass.     Tenderness: There is no abdominal tenderness. There is no guarding or rebound.  Musculoskeletal:        General: No tenderness. Normal range of motion.     Cervical back: Normal range of motion.  Lymphadenopathy:     Cervical: No cervical adenopathy.  Skin:    General: Skin is warm and dry.     Findings: No rash.  Neurological:     Mental Status: He is alert and oriented to person, place, and time.     Cranial Nerves: No cranial nerve deficit.     Motor: No abnormal muscle tone.     Coordination: Coordination normal.     Gait: Gait normal.     Deep Tendon Reflexes: Reflexes  are normal and symmetric.  Psychiatric:        Behavior: Behavior normal.        Thought Content: Thought content normal.        Judgment: Judgment normal.     Lab Results  Component Value Date   WBC 7.6 04/17/2023   HGB 13.7 04/17/2023   HCT 41.7 04/17/2023   PLT 219.0 04/17/2023   GLUCOSE 91 04/17/2023   CHOL 175 04/17/2023   TRIG 37.0 04/17/2023   HDL 78.50 04/17/2023   LDLCALC 89 04/17/2023   ALT 11 04/17/2023   AST 18 04/17/2023   NA 139 04/17/2023   K 3.9 04/17/2023   CL 100 04/17/2023   CREATININE 0.70 04/17/2023   BUN 11 04/17/2023   CO2 25 04/17/2023   TSH 0.89 04/17/2023   PSA 1.08 04/17/2023   HGBA1C 4.7 05/21/2018    No results found.  Assessment & Plan:   Problem List Items Addressed This Visit     PTSD (post-traumatic stress disorder)    Pt started to see a psychologist      INSOMNIA - Primary    Shift workProblems staying asleep. Working 6 pm to 6 am now Zolpidem prn  Potential benefits of a long term opioids use as well as potential risks (i.e. addiction risk, apnea etc) and complications (i.e. Somnolence, constipation and others) were explained to the patient and were aknowledged.      Hypokalemia    Monitor K      B12 deficiency    On B12      Leg cramp    Use Magnesium oil spray for muscle cramps       Other Visit Diagnoses     Dyslipidemia       Relevant Orders   CT CARDIAC SCORING (SELF PAY ONLY)         Meds ordered this encounter  Medications   zolpidem (AMBIEN) 10 MG tablet    Sig: Take 0.5-1 tablets (5-10 mg total) by mouth at bedtime as needed for sleep.    Dispense:  30 tablet    Refill:  2      Follow-up: Return in about 3 months (around 09/19/2023) for a follow-up visit.  Sonda Primes, MD

## 2023-06-19 NOTE — Assessment & Plan Note (Signed)
On B12 

## 2023-06-19 NOTE — Assessment & Plan Note (Signed)
Shift workProblems staying asleep. Working 6 pm to 6 am now Zolpidem prn  Potential benefits of a long term opioids use as well as potential risks (i.e. addiction risk, apnea etc) and complications (i.e. Somnolence, constipation and others) were explained to the patient and were aknowledged.

## 2023-06-19 NOTE — Assessment & Plan Note (Signed)
Monitor K+

## 2023-06-19 NOTE — Assessment & Plan Note (Signed)
Use Magnesium oil spray for muscle cramps 

## 2023-06-20 ENCOUNTER — Ambulatory Visit: Payer: Commercial Managed Care - HMO | Admitting: Internal Medicine

## 2023-08-09 ENCOUNTER — Ambulatory Visit
Admission: RE | Admit: 2023-08-09 | Discharge: 2023-08-09 | Disposition: A | Discharge status: Home / Self Care | Payer: No Typology Code available for payment source | Source: Ambulatory Visit | Attending: Internal Medicine | Admitting: Internal Medicine

## 2023-08-09 DIAGNOSIS — E785 Hyperlipidemia, unspecified: Secondary | ICD-10-CM

## 2023-09-17 ENCOUNTER — Ambulatory Visit: Payer: BC Managed Care – PPO | Admitting: Internal Medicine

## 2023-09-17 ENCOUNTER — Encounter: Payer: Self-pay | Admitting: Internal Medicine

## 2023-09-17 VITALS — BP 110/72 | HR 79 | Temp 98.3°F | Ht 67.0 in | Wt 152.0 lb

## 2023-09-17 DIAGNOSIS — F4310 Post-traumatic stress disorder, unspecified: Secondary | ICD-10-CM

## 2023-09-17 DIAGNOSIS — Z13220 Encounter for screening for lipoid disorders: Secondary | ICD-10-CM

## 2023-09-17 DIAGNOSIS — B001 Herpesviral vesicular dermatitis: Secondary | ICD-10-CM

## 2023-09-17 DIAGNOSIS — Z0000 Encounter for general adult medical examination without abnormal findings: Secondary | ICD-10-CM

## 2023-09-17 DIAGNOSIS — E538 Deficiency of other specified B group vitamins: Secondary | ICD-10-CM

## 2023-09-17 DIAGNOSIS — Z125 Encounter for screening for malignant neoplasm of prostate: Secondary | ICD-10-CM | POA: Diagnosis not present

## 2023-09-17 DIAGNOSIS — N528 Other male erectile dysfunction: Secondary | ICD-10-CM

## 2023-09-17 LAB — COMPREHENSIVE METABOLIC PANEL
ALT: 15 U/L (ref 0–53)
AST: 19 U/L (ref 0–37)
Albumin: 4.4 g/dL (ref 3.5–5.2)
Alkaline Phosphatase: 64 U/L (ref 39–117)
BUN: 11 mg/dL (ref 6–23)
CO2: 30 meq/L (ref 19–32)
Calcium: 9.6 mg/dL (ref 8.4–10.5)
Chloride: 102 meq/L (ref 96–112)
Creatinine, Ser: 0.87 mg/dL (ref 0.40–1.50)
GFR: 94.42 mL/min (ref >60.00)
Glucose, Bld: 82 mg/dL (ref 70–99)
Potassium: 4.3 meq/L (ref 3.5–5.1)
Sodium: 139 meq/L (ref 135–145)
Total Bilirubin: 2.3 mg/dL — ABNORMAL HIGH (ref 0.2–1.2)
Total Protein: 7.4 g/dL (ref 6.0–8.3)

## 2023-09-17 LAB — CBC WITH DIFFERENTIAL/PLATELET
Basophils Absolute: 0 10*3/uL (ref 0.0–0.1)
Basophils Relative: 0.7 % (ref 0.0–3.0)
Eosinophils Absolute: 0.2 10*3/uL (ref 0.0–0.7)
Eosinophils Relative: 4.2 % (ref 0.0–5.0)
HCT: 42.5 % (ref 39.0–52.0)
Hemoglobin: 13.7 g/dL (ref 13.0–17.0)
Lymphocytes Relative: 33.4 % (ref 12.0–46.0)
Lymphs Abs: 1.2 10*3/uL (ref 0.7–4.0)
MCHC: 32.2 g/dL (ref 30.0–36.0)
MCV: 96.2 fL (ref 78.0–100.0)
Monocytes Absolute: 0.4 10*3/uL (ref 0.1–1.0)
Monocytes Relative: 11.6 % (ref 3.0–12.0)
Neutro Abs: 1.8 10*3/uL (ref 1.4–7.7)
Neutrophils Relative %: 50.1 % (ref 43.0–77.0)
Platelets: 223 10*3/uL (ref 150.0–400.0)
RBC: 4.41 Mil/uL (ref 4.22–5.81)
RDW: 12.8 % (ref 11.5–15.5)
WBC: 3.6 10*3/uL — ABNORMAL LOW (ref 4.0–10.5)

## 2023-09-17 LAB — URINALYSIS
Bilirubin Urine: NEGATIVE
Hgb urine dipstick: NEGATIVE
Ketones, ur: NEGATIVE
Leukocytes,Ua: NEGATIVE
Nitrite: NEGATIVE
Specific Gravity, Urine: 1.02 (ref 1.000–1.030)
Total Protein, Urine: NEGATIVE
Urine Glucose: NEGATIVE
Urobilinogen, UA: 2 — AB (ref 0.0–1.0)
pH: 8 (ref 5.0–8.0)

## 2023-09-17 LAB — LIPID PANEL
Cholesterol: 163 mg/dL (ref 0–200)
HDL: 77.8 mg/dL (ref >39.00)
LDL Cholesterol: 49 mg/dL (ref 0–99)
NonHDL: 85.1
Total CHOL/HDL Ratio: 2
Triglycerides: 179 mg/dL — ABNORMAL HIGH (ref 0.0–149.0)
VLDL: 35.8 mg/dL (ref 0.0–40.0)

## 2023-09-17 LAB — VITAMIN B12: Vitamin B-12: 841 pg/mL (ref 211–911)

## 2023-09-17 LAB — TSH: TSH: 2.15 u[IU]/mL (ref 0.35–5.50)

## 2023-09-17 LAB — PSA: PSA: 1.13 ng/mL (ref 0.10–4.00)

## 2023-09-17 MED ORDER — VALACYCLOVIR HCL 500 MG PO TABS: 500.0000 mg | ORAL_TABLET | Freq: Two times a day (BID) | ORAL | 3 refills | Status: DC

## 2023-09-17 MED ORDER — ACYCLOVIR 5 % EX OINT: 1.0000 | TOPICAL_OINTMENT | CUTANEOUS | 1 refills | Status: DC

## 2023-09-17 NOTE — Progress Notes (Signed)
Subjective:  Patient ID: George [name], male    DOB: 1966/02/29  Age: 57 y.o. MRN: [id number]  CC: Medical Management of Chronic Issues (3 MNTH F/U)   HPI Ahron Degner presents for cold sores, h/o ED, B12 def  Outpatient Medications Prior to Visit  Medication Sig Dispense Refill   Cholecalciferol (VITAMIN D3) 50 MCG (2000 UT) capsule Take 1 capsule (2,000 Units total) by mouth daily. 100 capsule 3   ciclopirox (PENLAC) 8 % solution Apply topically at bedtime. Apply over nail and surrounding skin daily 6.6 mL 1   clotrimazole-betamethasone (LOTRISONE) cream Apply 1 Application topically daily. For under arms and groin rash 90 g 3   Cyanocobalamin (VITAMIN B-12) 1000 MCG SUBL Place 1 tablet (1,000 mcg total) under the tongue daily. 100 tablet 3   diphenhydrAMINE (BENADRYL) 25 MG tablet Take 25 mg by mouth daily at 2 am.     tadalafil (CIALIS) 5 MG tablet TAKE 1 TABLET (5 MG TOTAL) BY MOUTH DAILY. 30 tablet 5   Vitamin D, Ergocalciferol, (DRISDOL) 1.25 MG (50000 UNIT) CAPS capsule Take 50,000 Units by mouth daily at 2 am.     zolpidem (AMBIEN) 10 MG tablet Take 0.5-1 tablets (5-10 mg total) by mouth at bedtime as needed for sleep. 30 tablet 2   No facility-administered medications prior to visit.    ROS: Review of Systems  Constitutional:  Negative for appetite change, fatigue and unexpected weight change.  HENT:  Negative for congestion, nosebleeds, sneezing, sore throat and trouble swallowing.   Eyes:  Negative for itching and visual disturbance.  Respiratory:  Negative for cough.   Cardiovascular:  Negative for chest pain, palpitations and leg swelling.  Gastrointestinal:  Negative for abdominal distention, blood in stool, diarrhea and nausea.  Genitourinary:  Negative for frequency and hematuria.  Musculoskeletal:  Negative for back pain, gait problem, joint swelling and neck pain.  Skin:  Negative for rash.  Neurological:  Negative for dizziness, tremors, speech difficulty  and weakness.  Psychiatric/Behavioral:  Negative for agitation, dysphoric mood, sleep disturbance and suicidal ideas. The patient is nervous/anxious.     Objective:  BP 110/72 (BP Location: Right Arm, Patient Position: Sitting, Cuff Size: Normal)   Pulse 79   Temp 98.3 F (36.8 C) (Oral)   Ht 5\' 7"  (1.702 m)   Wt 152 lb (68.9 kg)   SpO2 98%   BMI 23.81 kg/m   BP Readings from Last 3 Encounters:  09/17/23 110/72  06/19/23 110/70  04/17/23 120/80    Wt Readings from Last 3 Encounters:  09/17/23 152 lb (68.9 kg)  06/19/23 136 lb (61.7 kg)  04/17/23 136 lb (61.7 kg)    Physical Exam Constitutional:      General: He is not in acute distress.    Appearance: He is well-developed.     Comments: NAD  Eyes:     Conjunctiva/sclera: Conjunctivae normal.     Pupils: Pupils are equal, round, and reactive to light.  Neck:     Thyroid: No thyromegaly.     Vascular: No JVD.  Cardiovascular:     Rate and Rhythm: Normal rate and regular rhythm.     Heart sounds: Normal heart sounds. No murmur heard.    No friction rub. No gallop.  Pulmonary:     Effort: Pulmonary effort is normal. No respiratory distress.     Breath sounds: Normal breath sounds. No wheezing or rales.  Chest:     Chest wall: No tenderness.  Abdominal:  General: Bowel sounds are normal. There is no distension.     Palpations: Abdomen is soft. There is no mass.     Tenderness: There is no abdominal tenderness. There is no guarding or rebound.  Musculoskeletal:        General: No tenderness. Normal range of motion.     Cervical back: Normal range of motion.  Lymphadenopathy:     Cervical: No cervical adenopathy.  Skin:    General: Skin is warm and dry.     Findings: No rash.  Neurological:     Mental Status: He is alert and oriented to person, place, and time.     Cranial Nerves: No cranial nerve deficit.     Motor: No abnormal muscle tone.     Coordination: Coordination normal.     Gait: Gait normal.      Deep Tendon Reflexes: Reflexes are normal and symmetric.  Psychiatric:        Behavior: Behavior normal.        Thought Content: Thought content normal.        Judgment: Judgment normal.     Lab Results  Component Value Date   WBC 3.6 (L) 09/17/2023   HGB 13.7 09/17/2023   HCT 42.5 09/17/2023   PLT 223.0 09/17/2023   GLUCOSE 82 09/17/2023   CHOL 163 09/17/2023   TRIG 179.0 (H) 09/17/2023   HDL 77.80 09/17/2023   LDLCALC 49 09/17/2023   ALT 15 09/17/2023   AST 19 09/17/2023   NA 139 09/17/2023   K 4.3 09/17/2023   CL 102 09/17/2023   CREATININE 0.87 09/17/2023   BUN 11 09/17/2023   CO2 30 09/17/2023   TSH 2.15 09/17/2023   PSA 1.13 09/17/2023   HGBA1C 4.7 05/21/2018    CT CARDIAC SCORING ([facility] LOCATIONS ONLY)  Result Date: 08/18/2023 CLINICAL DATA:  Dyslipidemia * Tracking Code: FCC * EXAM: CT CARDIAC CORONARY ARTERY CALCIUM SCORE TECHNIQUE: Non-contrast imaging through the heart was performed using prospective ECG gating. Image post processing was performed on an independent workstation, allowing for quantitative analysis of the heart and coronary arteries. Note that this exam targets the heart and the chest was not imaged in its entirety. COMPARISON:  None available. FINDINGS: CORONARY CALCIUM SCORES: Left Main: 0 LAD: 0 LCx: 0 RCA/PDA: 0 Total Agatston Score: 0 MESA database percentile: 0 AORTA MEASUREMENTS: Ascending Aorta: 3.6 cm Descending Aorta:2.5 cm OTHER FINDINGS: Heart is normal size. Aorta normal caliber. No adenopathy. No confluent airspace opacities or effusions. No acute findings in the upper abdomen. Chest wall soft tissues are unremarkable. No acute bony abnormality. IMPRESSION: No visible coronary artery calcifications. Total coronary calcium score of 0. No acute or significant extracardiac abnormality. Electronically Signed   By: Charlett Nose M.D.   On: 08/18/2023 11:42    Assessment & Plan:   Problem List Items Addressed This Visit     PTSD (post-traumatic  stress disorder) - Primary    Better      Well adult exam   Relevant Orders   TSH (Completed)   Urinalysis (Completed)   CBC with Differential/Platelet (Completed)   Lipid panel (Completed)   PSA (Completed)   Comprehensive metabolic panel (Completed)   Vitamin B12 (Completed)   Cold sore    Recurrent. Valtrex as needed      Relevant Medications   valACYclovir (VALTREX) 500 MG tablet   B12 deficiency   Relevant Orders   Vitamin B12 (Completed)   Erectile dysfunction    Viagra prn d/c  Cialis daily 5 mg Urol ref         Meds ordered this encounter  Medications   DISCONTD: acyclovir ointment (ZOVIRAX) 5 %    Sig: Apply 1 Application topically every 3 (three) hours.    Dispense:  15 g    Refill:  1   valACYclovir (VALTREX) 500 MG tablet    Sig: Take 1 tablet (500 mg total) by mouth 2 (two) times daily.    Dispense:  14 tablet    Refill:  3      Follow-up: Return in about 6 months (around 03/16/2024) for Wellness Exam.  Sonda Primes, MD

## 2023-09-17 NOTE — Assessment & Plan Note (Signed)
Better  

## 2023-10-07 ENCOUNTER — Telehealth: Payer: Self-pay | Admitting: Internal Medicine

## 2023-10-07 NOTE — Telephone Encounter (Signed)
Patient states that [facility] on [address] needs more information on the Acyclovir Ointment that was prescribed for the patient.  Please contact pharmacy

## 2023-10-08 ENCOUNTER — Telehealth: Payer: Self-pay

## 2023-10-08 ENCOUNTER — Other Ambulatory Visit (HOSPITAL_COMMUNITY): Payer: Self-pay

## 2023-10-08 MED ORDER — ACYCLOVIR 5 % EX OINT: 1.0000 | TOPICAL_OINTMENT | CUTANEOUS | 1 refills | Status: DC

## 2023-10-08 NOTE — Telephone Encounter (Signed)
Patient states this needs a prior authorization sent through

## 2023-10-08 NOTE — Telephone Encounter (Signed)
Pharmacy Patient Advocate Encounter   Received notification from Pt Calls Messages that prior authorization for Acyclovir 5% oint is required/requested.   Insurance verification completed.   The patient is insured through [employer] .   Per test claim: PA required; PA submitted to above mentioned insurance via CoverMyMeds Key/confirmation #/EOC [id number] Status is pending

## 2023-10-08 NOTE — Telephone Encounter (Signed)
Okay.  Instructions were corrected.  Thanks

## 2023-10-13 NOTE — Assessment & Plan Note (Addendum)
Recurrent. Valtrex as needed

## 2023-10-13 NOTE — Assessment & Plan Note (Signed)
Viagra prn d/c Cialis daily 5 mg Urol ref

## 2023-10-16 ENCOUNTER — Telehealth: Payer: Self-pay | Admitting: Internal Medicine

## 2023-10-16 NOTE — Telephone Encounter (Signed)
Pt called wanting to know if he can get alternative for Rx for acyclovir. He stated he has had his medication in over an month please advise.

## 2023-10-17 MED ORDER — ACYCLOVIR 5 % EX CREA: 1.0000 | TOPICAL_CREAM | CUTANEOUS | 3 refills | Status: AC

## 2023-10-17 NOTE — Telephone Encounter (Signed)
Pharmacy Patient Advocate Encounter  Received notification from [facility] that Prior Authorization for Acyclovir 5% cream has been DENIED.  Full denial letter will be uploaded to the media tab. See denial reason below.   PA #/Case ID/Reference #: [id number]

## 2023-10-17 NOTE — Telephone Encounter (Signed)
I sent a prescription for acyclovir cream.  It may be cheaper. It is much less expensive to use tablets for cold sores as needed. Thanks

## 2023-10-17 NOTE — Telephone Encounter (Signed)
Tried calling the pt to inform him of Dr. Loren Racer advise of "I sent a prescription for acyclovir cream.  It may be cheaper. It is much less expensive to use tablets for cold sores as needed."  Pts phone disconnected.

## 2023-11-21 ENCOUNTER — Other Ambulatory Visit (HOSPITAL_COMMUNITY): Payer: Self-pay

## 2023-11-29 ENCOUNTER — Other Ambulatory Visit: Payer: Self-pay | Admitting: Internal Medicine

## 2023-11-29 DIAGNOSIS — N401 Enlarged prostate with lower urinary tract symptoms: Secondary | ICD-10-CM

## 2023-12-12 ENCOUNTER — Other Ambulatory Visit (HOSPITAL_COMMUNITY): Payer: Self-pay

## 2024-01-03 ENCOUNTER — Other Ambulatory Visit (HOSPITAL_COMMUNITY): Payer: Self-pay

## 2024-01-27 ENCOUNTER — Other Ambulatory Visit (HOSPITAL_COMMUNITY): Payer: Self-pay

## 2024-02-18 ENCOUNTER — Ambulatory Visit (INDEPENDENT_AMBULATORY_CARE_PROVIDER_SITE_OTHER): Admitting: Internal Medicine

## 2024-02-18 ENCOUNTER — Encounter: Payer: Self-pay | Admitting: Internal Medicine

## 2024-02-18 VITALS — BP 112/68 | HR 82 | Temp 97.7°F | Ht 67.0 in | Wt 148.0 lb

## 2024-02-18 DIAGNOSIS — E538 Deficiency of other specified B group vitamins: Secondary | ICD-10-CM | POA: Diagnosis not present

## 2024-02-18 DIAGNOSIS — E559 Vitamin D deficiency, unspecified: Secondary | ICD-10-CM

## 2024-02-18 DIAGNOSIS — Z7721 Contact with and (suspected) exposure to potentially hazardous body fluids: Secondary | ICD-10-CM | POA: Diagnosis not present

## 2024-02-18 DIAGNOSIS — F411 Generalized anxiety disorder: Secondary | ICD-10-CM | POA: Diagnosis not present

## 2024-02-18 NOTE — Assessment & Plan Note (Signed)
 On B12

## 2024-02-18 NOTE — Progress Notes (Signed)
 Subjective:  Patient ID: George [name], male    DOB: 10-Jan-1966  Age: 57 y.o. MRN: [id number]  CC: std screening (Wants some std screening as he is in a new relationship)   HPI George [name] presents for STD check for a future reference.  C/o allergies, TMJ noise, cold sores  Outpatient Medications Prior to Visit  Medication Sig Dispense Refill   acyclovir cream (ZOVIRAX) 5 % Apply 1 Application topically every 3 (three) hours. 15 g 3   Cholecalciferol (VITAMIN D3) 50 MCG (2000 UT) capsule Take 1 capsule (2,000 Units total) by mouth daily. 100 capsule 3   ciclopirox (PENLAC) 8 % solution Apply topically at bedtime. Apply over nail and surrounding skin daily 6.6 mL 1   clotrimazole-betamethasone (LOTRISONE) cream Apply 1 Application topically daily. For under arms and groin rash 90 g 3   Cyanocobalamin (VITAMIN B-12) 1000 MCG SUBL Place 1 tablet (1,000 mcg total) under the tongue daily. 100 tablet 3   tadalafil (CIALIS) 5 MG tablet TAKE 1 TABLET (5 MG TOTAL) BY MOUTH DAILY. 30 tablet 11   valACYclovir (VALTREX) 500 MG tablet Take 1 tablet (500 mg total) by mouth 2 (two) times daily. 14 tablet 3   Vitamin D, Ergocalciferol, (DRISDOL) 1.25 MG (50000 UNIT) CAPS capsule Take 50,000 Units by mouth daily at 2 am.     zolpidem (AMBIEN) 10 MG tablet Take 0.5-1 tablets (5-10 mg total) by mouth at bedtime as needed for sleep. 30 tablet 2   diphenhydrAMINE (BENADRYL) 25 MG tablet Take 25 mg by mouth daily at 2 am. (Patient not taking: Reported on 02/18/2024)     No facility-administered medications prior to visit.    ROS: Review of Systems  Constitutional:  Negative for appetite change, fatigue and unexpected weight change.  HENT:  Negative for congestion, nosebleeds, sneezing, sore throat and trouble swallowing.   Eyes:  Negative for itching and visual disturbance.  Respiratory:  Negative for cough.   Cardiovascular:  Negative for chest pain, palpitations and leg swelling.   Gastrointestinal:  Negative for abdominal distention, blood in stool, diarrhea and nausea.  Genitourinary:  Negative for frequency and hematuria.  Musculoskeletal:  Negative for back pain, gait problem, joint swelling and neck pain.  Skin:  Negative for rash.  Neurological:  Negative for dizziness, tremors, speech difficulty and weakness.  Psychiatric/Behavioral:  Negative for agitation, dysphoric mood and sleep disturbance. The patient is not nervous/anxious.     Objective:  BP 112/68 (BP Location: Left Arm, Patient Position: Sitting)   Pulse 82   Temp 97.7 F (36.5 C) (Temporal)   Ht 5\' 7"  (1.702 m)   Wt 148 lb (67.1 kg)   SpO2 99%   BMI 23.18 kg/m   BP Readings from Last 3 Encounters:  02/18/24 112/68  09/17/23 110/72  06/19/23 110/70    Wt Readings from Last 3 Encounters:  02/18/24 148 lb (67.1 kg)  09/17/23 152 lb (68.9 kg)  06/19/23 136 lb (61.7 kg)    Physical Exam Constitutional:      General: He is not in acute distress.    Appearance: He is well-developed.     Comments: NAD  Eyes:     Conjunctiva/sclera: Conjunctivae normal.     Pupils: Pupils are equal, round, and reactive to light.  Neck:     Thyroid: No thyromegaly.     Vascular: No JVD.  Cardiovascular:     Rate and Rhythm: Normal rate and regular rhythm.     Heart sounds: Normal heart  sounds. No murmur heard.    No friction rub. No gallop.  Pulmonary:     Effort: Pulmonary effort is normal. No respiratory distress.     Breath sounds: Normal breath sounds. No wheezing or rales.  Chest:     Chest wall: No tenderness.  Abdominal:     General: Bowel sounds are normal. There is no distension.     Palpations: Abdomen is soft. There is no mass.     Tenderness: There is no abdominal tenderness. There is no guarding or rebound.  Musculoskeletal:        General: No tenderness. Normal range of motion.     Cervical back: Normal range of motion.  Lymphadenopathy:     Cervical: No cervical adenopathy.   Skin:    General: Skin is warm and dry.     Findings: No rash.  Neurological:     Mental Status: He is alert and oriented to person, place, and time.     Cranial Nerves: No cranial nerve deficit.     Motor: No abnormal muscle tone.     Coordination: Coordination normal.     Gait: Gait normal.     Deep Tendon Reflexes: Reflexes are normal and symmetric.  Psychiatric:        Behavior: Behavior normal.        Thought Content: Thought content normal.        Judgment: Judgment normal.     Lab Results  Component Value Date   WBC 3.6 (L) 09/17/2023   HGB 13.7 09/17/2023   HCT 42.5 09/17/2023   PLT 223.0 09/17/2023   GLUCOSE 82 09/17/2023   CHOL 163 09/17/2023   TRIG 179.0 (H) 09/17/2023   HDL 77.80 09/17/2023   LDLCALC 49 09/17/2023   ALT 15 09/17/2023   AST 19 09/17/2023   NA 139 09/17/2023   K 4.3 09/17/2023   CL 102 09/17/2023   CREATININE 0.87 09/17/2023   BUN 11 09/17/2023   CO2 30 09/17/2023   TSH 2.15 09/17/2023   PSA 1.13 09/17/2023   HGBA1C 4.7 05/21/2018    CT CARDIAC SCORING ([facility] LOCATIONS ONLY) Result Date: 08/18/2023 CLINICAL DATA:  Dyslipidemia * Tracking Code: FCC * EXAM: CT CARDIAC CORONARY ARTERY CALCIUM SCORE TECHNIQUE: Non-contrast imaging through the heart was performed using prospective ECG gating. Image post processing was performed on an independent workstation, allowing for quantitative analysis of the heart and coronary arteries. Note that this exam targets the heart and the chest was not imaged in its entirety. COMPARISON:  None available. FINDINGS: CORONARY CALCIUM SCORES: Left Main: 0 LAD: 0 LCx: 0 RCA/PDA: 0 Total Agatston Score: 0 MESA database percentile: 0 AORTA MEASUREMENTS: Ascending Aorta: 3.6 cm Descending Aorta:2.5 cm OTHER FINDINGS: Heart is normal size. Aorta normal caliber. No adenopathy. No confluent airspace opacities or effusions. No acute findings in the upper abdomen. Chest wall soft tissues are unremarkable. No acute bony abnormality.  IMPRESSION: No visible coronary artery calcifications. Total coronary calcium score of 0. No acute or significant extracardiac abnormality. Electronically Signed   By: Charlett Nose M.D.   On: 08/18/2023 11:42    Assessment & Plan:   Problem List Items Addressed This Visit     B12 deficiency   On B12      Vitamin D deficiency   On Vit D      Anxiety state   Chronic       Exposure to blood or body fluid - Primary   Remote Will order STD tests  Safe sex      Relevant Orders   HIV Antibody (routine testing w rflx)   GC/Chlamydia Probe Amp   RPR      No orders of the defined types were placed in this encounter.     Follow-up: Return in about 3 months (around 05/19/2024) for a follow-up visit.  Sonda Primes, MD

## 2024-02-18 NOTE — Assessment & Plan Note (Signed)
 On Vit D

## 2024-02-18 NOTE — Assessment & Plan Note (Signed)
 Chronic.

## 2024-02-18 NOTE — Assessment & Plan Note (Addendum)
 Remote Will order STD tests Safe sex

## 2024-02-19 LAB — HIV ANTIBODY (ROUTINE TESTING W REFLEX): HIV 1&2 Ab, 4th Generation: NONREACTIVE

## 2024-02-19 LAB — RPR: RPR Ser Ql: NONREACTIVE

## 2024-02-20 ENCOUNTER — Encounter: Payer: Self-pay | Admitting: Family

## 2024-02-20 LAB — GC/CHLAMYDIA PROBE AMP
Chlamydia trachomatis, NAA: NEGATIVE
Neisseria Gonorrhoeae by PCR: NEGATIVE

## 2024-02-21 DIAGNOSIS — F4310 Post-traumatic stress disorder, unspecified: Secondary | ICD-10-CM | POA: Diagnosis not present

## 2024-02-21 DIAGNOSIS — Z634 Disappearance and death of family member: Secondary | ICD-10-CM | POA: Diagnosis not present

## 2024-03-16 ENCOUNTER — Ambulatory Visit: Payer: BC Managed Care – PPO | Admitting: Internal Medicine

## 2024-04-08 ENCOUNTER — Ambulatory Visit: Payer: Self-pay

## 2024-04-08 NOTE — Telephone Encounter (Signed)
  Chief Complaint: Headache Symptoms: Headache with nausea, self induced vomiting x 1 Frequency: since last night Pertinent Negatives: Patient denies stiff neck, fever, cold/flu symptoms, vision changes, numbness/weakness of arms or legs, hx of migraine Disposition: [] ED /[] Urgent Care (no appt availability in office) / [] Appointment(In office/virtual)/ []  [facility]/ [x] Home Care/ [] Refused Recommended Disposition /[] [facility]/ []  Follow-up with PCP Additional Notes: Pt reports onset of headache last night that has decreased in nature to a pain level of 5/10 after sleeping. Patient has not tried any pain medication since onset. Patient did induce one episode of vomiting to determine if this would relieve headache. Patient feels headache on top right side. Patient advised to monitor at home and call if any new symptoms develop or pain is unrelieved by Tylenol .   Reason for Disposition  [1] Headache AND [2] has not taken pain medications  Answer Assessment - Initial Assessment Questions 1. LOCATION: "Where does it hurt?"      Top on right side  2. ONSET: "When did the headache start?" (Minutes, hours or days)      Last night 3. PATTERN: "Does the pain come and go, or has it been constant since it started?"     Been constant since last night 4. SEVERITY: "How bad is the pain?" and "What does it keep you from doing?"  (e.g., Scale 1-10; mild, moderate, or severe)   - MILD (1-3): doesn't interfere with normal activities    - MODERATE (4-7): interferes with normal activities or awakens from sleep    - SEVERE (8-10): excruciating pain, unable to do any normal activities        5 5. RECURRENT SYMPTOM: "Have you ever had headaches before?" If Yes, ask: "When was the last time?" and "What happened that time?"      No 6. CAUSE: "What do you think is causing the headache?"     Unsure - patient states it could be related to fumes at work 7. MIGRAINE: "Have you been diagnosed  with migraine headaches?" If Yes, ask: "Is this headache similar?"      No 8. HEAD INJURY: "Has there been any recent injury to the head?"      No 9. OTHER SYMPTOMS: "Do you have any other symptoms?" (fever, stiff neck, eye pain, sore throat, cold symptoms)     Induced vomiting x1, no nausea  Protocols used: Headache-A-AH

## 2024-05-19 ENCOUNTER — Encounter: Payer: Self-pay | Admitting: Internal Medicine

## 2024-05-19 ENCOUNTER — Ambulatory Visit (INDEPENDENT_AMBULATORY_CARE_PROVIDER_SITE_OTHER): Admitting: Internal Medicine

## 2024-05-19 VITALS — BP 110/70 | HR 98 | Temp 98.2°F | Ht 67.0 in | Wt 148.0 lb

## 2024-05-19 DIAGNOSIS — L723 Sebaceous cyst: Secondary | ICD-10-CM | POA: Insufficient documentation

## 2024-05-19 DIAGNOSIS — N401 Enlarged prostate with lower urinary tract symptoms: Secondary | ICD-10-CM

## 2024-05-19 DIAGNOSIS — F4310 Post-traumatic stress disorder, unspecified: Secondary | ICD-10-CM

## 2024-05-19 DIAGNOSIS — E538 Deficiency of other specified B group vitamins: Secondary | ICD-10-CM

## 2024-05-19 DIAGNOSIS — N320 Bladder-neck obstruction: Secondary | ICD-10-CM | POA: Diagnosis not present

## 2024-05-19 DIAGNOSIS — R350 Frequency of micturition: Secondary | ICD-10-CM

## 2024-05-19 MED ORDER — TADALAFIL 5 MG PO TABS: 5.0000 mg | ORAL_TABLET | Freq: Every day | ORAL | 3 refills | Status: AC

## 2024-05-19 MED ORDER — VALACYCLOVIR HCL 500 MG PO TABS: 500.0000 mg | ORAL_TABLET | Freq: Two times a day (BID) | ORAL | 3 refills | Status: AC

## 2024-05-19 NOTE — Assessment & Plan Note (Signed)
 On Tadalafil  5 mg/day

## 2024-05-19 NOTE — Assessment & Plan Note (Addendum)
 Anxiety, poor sleep - Start seeing a psychologist at the [facility]

## 2024-05-19 NOTE — Progress Notes (Signed)
 Subjective:  Patient ID: George [name], male    DOB: 05-13-1966  Age: 58 y.o. MRN: [id number]  CC: Medical Management of Chronic Issues (3 mnth f/u)   HPI George [name] presents for a cyst in the R axilla - NT, ED, H simplex   Outpatient Medications Prior to Visit  Medication Sig Dispense Refill   acyclovir  cream (ZOVIRAX ) 5 % Apply 1 Application topically every 3 (three) hours. 15 g 3   Cholecalciferol (VITAMIN D3) 50 MCG (2000 UT) capsule Take 1 capsule (2,000 Units total) by mouth daily. 100 capsule 3   ciclopirox  (PENLAC ) 8 % solution Apply topically at bedtime. Apply over nail and surrounding skin daily 6.6 mL 1   clotrimazole -betamethasone  (LOTRISONE ) cream Apply 1 Application topically daily. For under arms and groin rash 90 g 3   Cyanocobalamin  (VITAMIN B-12) 1000 MCG SUBL Place 1 tablet (1,000 mcg total) under the tongue daily. 100 tablet 3   tadalafil  (CIALIS ) 5 MG tablet TAKE 1 TABLET (5 MG TOTAL) BY MOUTH DAILY. 30 tablet 11   valACYclovir  (VALTREX ) 500 MG tablet Take 1 tablet (500 mg total) by mouth 2 (two) times daily. 14 tablet 3   Vitamin D , Ergocalciferol , (DRISDOL ) 1.25 MG (50000 UNIT) CAPS capsule Take 50,000 Units by mouth daily at 2 am.     zolpidem  (AMBIEN ) 10 MG tablet Take 0.5-1 tablets (5-10 mg total) by mouth at bedtime as needed for sleep. 30 tablet 2   No facility-administered medications prior to visit.    ROS: Review of Systems  Constitutional:  Negative for appetite change, fatigue and unexpected weight change.  HENT:  Negative for congestion, nosebleeds, sneezing, sore throat and trouble swallowing.   Eyes:  Negative for itching and visual disturbance.  Respiratory:  Negative for cough.   Cardiovascular:  Negative for chest pain, palpitations and leg swelling.  Gastrointestinal:  Negative for abdominal distention, blood in stool, diarrhea and nausea.  Genitourinary:  Negative for frequency and hematuria.  Musculoskeletal:  Negative for back  pain, gait problem, joint swelling and neck pain.  Skin:  Negative for rash.  Neurological:  Negative for dizziness, tremors, speech difficulty and weakness.  Psychiatric/Behavioral:  Negative for agitation, dysphoric mood, sleep disturbance and suicidal ideas. The patient is not nervous/anxious.     Objective:  BP 110/70   Pulse 98   Temp 98.2 F (36.8 C) (Oral)   Ht 5' 7 (1.702 m)   Wt 148 lb (67.1 kg)   SpO2 (!) 68%   BMI 23.18 kg/m   BP Readings from Last 3 Encounters:  05/19/24 110/70  02/18/24 112/68  09/17/23 110/72    Wt Readings from Last 3 Encounters:  05/19/24 148 lb (67.1 kg)  02/18/24 148 lb (67.1 kg)  09/17/23 152 lb (68.9 kg)    Physical Exam Constitutional:      General: George [name] is not in acute distress.    Appearance: George [name] is well-developed.     Comments: NAD  Eyes:     Conjunctiva/sclera: Conjunctivae normal.     Pupils: Pupils are equal, round, and reactive to light.  Neck:     Thyroid : No thyromegaly.     Vascular: No JVD.  Cardiovascular:     Rate and Rhythm: Normal rate and regular rhythm.     Heart sounds: Normal heart sounds. No murmur heard.    No friction rub. No gallop.  Pulmonary:     Effort: Pulmonary effort is normal. No respiratory distress.     Breath sounds: Normal breath  sounds. No wheezing or rales.  Chest:     Chest wall: No tenderness.  Abdominal:     General: Bowel sounds are normal. There is no distension.     Palpations: Abdomen is soft. There is no mass.     Tenderness: There is no abdominal tenderness. There is no guarding or rebound.  Musculoskeletal:        General: No tenderness. Normal range of motion.     Cervical back: Normal range of motion.  Lymphadenopathy:     Cervical: No cervical adenopathy.  Skin:    General: Skin is warm and dry.     Findings: Lesion present. No rash.  Neurological:     Mental Status: George [name] is alert and oriented to person, place, and time.     Cranial Nerves: No cranial nerve deficit.      Motor: No abnormal muscle tone.     Coordination: Coordination normal.     Gait: Gait normal.     Deep Tendon Reflexes: Reflexes are normal and symmetric.  Psychiatric:        Behavior: Behavior normal.        Thought Content: Thought content normal.        Judgment: Judgment normal.    R axilla sebaceous cyst 12x8 mm  Lab Results  Component Value Date   WBC 3.6 (L) 09/17/2023   HGB 13.7 09/17/2023   HCT 42.5 09/17/2023   PLT 223.0 09/17/2023   GLUCOSE 82 09/17/2023   CHOL 163 09/17/2023   TRIG 179.0 (H) 09/17/2023   HDL 77.80 09/17/2023   LDLCALC 49 09/17/2023   ALT 15 09/17/2023   AST 19 09/17/2023   NA 139 09/17/2023   K 4.3 09/17/2023   CL 102 09/17/2023   CREATININE 0.87 09/17/2023   BUN 11 09/17/2023   CO2 30 09/17/2023   TSH 2.15 09/17/2023   PSA 1.13 09/17/2023   HGBA1C 4.7 05/21/2018    CT CARDIAC SCORING ([facility] LOCATIONS ONLY) Result Date: 08/18/2023 CLINICAL DATA:  Dyslipidemia * Tracking Code: FCC * EXAM: CT CARDIAC CORONARY ARTERY CALCIUM SCORE TECHNIQUE: Non-contrast imaging through the heart was performed using prospective ECG gating. Image post processing was performed on an independent workstation, allowing for quantitative analysis of the heart and coronary arteries. Note that this exam targets the heart and the chest was not imaged in its entirety. COMPARISON:  None available. FINDINGS: CORONARY CALCIUM SCORES: Left Main: 0 LAD: 0 LCx: 0 RCA/PDA: 0 Total Agatston Score: 0 MESA database percentile: 0 AORTA MEASUREMENTS: Ascending Aorta: 3.6 cm Descending Aorta:2.5 cm OTHER FINDINGS: Heart is normal size. Aorta normal caliber. No adenopathy. No confluent airspace opacities or effusions. No acute findings in the upper abdomen. Chest wall soft tissues are unremarkable. No acute bony abnormality. IMPRESSION: No visible coronary artery calcifications. Total coronary calcium score of 0. No acute or significant extracardiac abnormality. Electronically Signed   By: Franky Crease M.D.   On: 08/18/2023 11:42    Assessment & Plan:   Problem List Items Addressed This Visit     PTSD (post-traumatic stress disorder)   Anxiety, poor sleep - Start seeing a psychologist at the [facility]      B12 deficiency   On B12 po      Sebaceous cyst of axilla - Primary   Skin bx was offered - George [name] will think about it Risk of infection was offered  R axilla sebaceous cyst 12x8 mm      Bladder neck obstruction   On Tadalafil   5 mg/day         No orders of the defined types were placed in this encounter.     Follow-up: Return in about 4 months (around 09/19/2024) for a follow-up visit.  Marolyn Noel, MD

## 2024-05-19 NOTE — Assessment & Plan Note (Signed)
 Skin bx was offered - he will think about it Risk of infection was offered  R axilla sebaceous cyst 12x8 mm

## 2024-05-19 NOTE — Assessment & Plan Note (Signed)
On B12 po 

## 2024-07-15 ENCOUNTER — Telehealth: Payer: Self-pay | Admitting: Radiology

## 2024-07-15 NOTE — Telephone Encounter (Signed)
 Copied from CRM #[id number]. Topic: Medical Record Request - Records Request >> Jul 15, 2024 11:43 AM George [name] wrote: Reason for CRM: Patient is calling for colonoscopy report he had done back in 2020. He would like to know if there is anyway to have that mailed to his home or electronically.

## 2024-07-27 ENCOUNTER — Telehealth: Payer: Self-pay | Admitting: Radiology

## 2024-07-27 NOTE — Telephone Encounter (Signed)
 Copied from CRM #[id number]. Topic: Clinical - Lab/Test Results >> Jul 27, 2024 11:17 AM Dedra B wrote: Reason for CRM: Pt is being seen at the [facility] and his liver enzymes were abnormal. Pt wants to verify that his past liver enzyme labs here at the clinic have always shown abnormal but were okay for him. Pt would like a call to discuss [facility].

## 2024-07-31 NOTE — Telephone Encounter (Signed)
Spoke with patient, gave a verbal understanding °

## 2024-07-31 NOTE — Telephone Encounter (Signed)
 George [name] has chronically elevated liver test called bilirubin.  His liver enzymes have always been normal. If she his liver enzymes are elevated (this would be new), he should undergo the workup suggested by the [facility]. Thank you

## 2024-09-21 ENCOUNTER — Ambulatory Visit: Admitting: Internal Medicine

## 2024-09-21 ENCOUNTER — Encounter: Payer: Self-pay | Admitting: Internal Medicine

## 2024-09-21 VITALS — BP 108/76 | HR 64 | Temp 98.3°F | Ht 67.0 in | Wt 150.2 lb

## 2024-09-21 DIAGNOSIS — R7989 Other specified abnormal findings of blood chemistry: Secondary | ICD-10-CM

## 2024-09-21 DIAGNOSIS — R599 Enlarged lymph nodes, unspecified: Secondary | ICD-10-CM | POA: Diagnosis not present

## 2024-09-21 DIAGNOSIS — E538 Deficiency of other specified B group vitamins: Secondary | ICD-10-CM | POA: Diagnosis not present

## 2024-09-21 DIAGNOSIS — F4310 Post-traumatic stress disorder, unspecified: Secondary | ICD-10-CM

## 2024-09-21 DIAGNOSIS — R590 Localized enlarged lymph nodes: Secondary | ICD-10-CM | POA: Diagnosis not present

## 2024-09-21 DIAGNOSIS — L723 Sebaceous cyst: Secondary | ICD-10-CM

## 2024-09-21 MED ORDER — AMOXICILLIN 875 MG PO TABS: 875.0000 mg | ORAL_TABLET | Freq: Two times a day (BID) | ORAL | 0 refills | Status: AC

## 2024-09-21 NOTE — Assessment & Plan Note (Signed)
On B12 po 

## 2024-09-21 NOTE — Assessment & Plan Note (Signed)
 Chronic Watching CBC

## 2024-09-21 NOTE — Assessment & Plan Note (Signed)
 Anxiety, poor sleep - Start seeing a psychologist at the [facility]

## 2024-09-21 NOTE — Assessment & Plan Note (Signed)
 Occasional L under the jaw - ?tooth crown needs work (L lower molar) Amoxicillin  prn Sch dental appt

## 2024-09-21 NOTE — Assessment & Plan Note (Addendum)
 Occasional L under the jaw - ?tooth crown needs work (L lower molar) Amoxicillin  prn Sch dental appt

## 2024-09-21 NOTE — Progress Notes (Signed)
 Subjective:  Patient ID: George [name], male    DOB: [birth year]  Age: 58 y.o. MRN: [id number]  CC: Medical Management of Chronic Issues (4 Month follow up)   HPI George [name] presents for B12 def, BPH, PTSD  Outpatient Medications Prior to Visit  Medication Sig Dispense Refill   acyclovir  cream (ZOVIRAX ) 5 % Apply 1 Application topically every 3 (three) hours. 15 g 3   Cholecalciferol (VITAMIN D3) 50 MCG (2000 UT) capsule Take 1 capsule (2,000 Units total) by mouth daily. 100 capsule 3   ciclopirox  (PENLAC ) 8 % solution Apply topically at bedtime. Apply over nail and surrounding skin daily 6.6 mL 1   clotrimazole -betamethasone  (LOTRISONE ) cream Apply 1 Application topically daily. For under arms and groin rash 90 g 3   Cyanocobalamin  (VITAMIN B-12) 1000 MCG SUBL Place 1 tablet (1,000 mcg total) under the tongue daily. 100 tablet 3   tadalafil  (CIALIS ) 5 MG tablet Take 1 tablet (5 mg total) by mouth daily. 90 tablet 3   valACYclovir  (VALTREX ) 500 MG tablet Take 1 tablet (500 mg total) by mouth 2 (two) times daily. 14 tablet 3   Vitamin D , Ergocalciferol , (DRISDOL ) 1.25 MG (50000 UNIT) CAPS capsule Take 50,000 Units by mouth daily at 2 am.     zolpidem  (AMBIEN ) 10 MG tablet Take 0.5-1 tablets (5-10 mg total) by mouth at bedtime as needed for sleep. 30 tablet 2   No facility-administered medications prior to visit.    ROS: Review of Systems  Constitutional:  Negative for appetite change, fatigue and unexpected weight change.  HENT:  Negative for congestion, nosebleeds, sneezing, sore throat and trouble swallowing.   Eyes:  Negative for itching and visual disturbance.  Respiratory:  Negative for cough.   Cardiovascular:  Negative for chest pain, palpitations and leg swelling.  Gastrointestinal:  Negative for abdominal distention, blood in stool, diarrhea and nausea.  Genitourinary:  Negative for frequency and hematuria.  Musculoskeletal:  Negative for back pain, gait problem,  joint swelling and neck pain.  Skin:  Positive for color change. Negative for rash.  Neurological:  Negative for dizziness, tremors, speech difficulty and weakness.  Psychiatric/Behavioral:  Negative for agitation, dysphoric mood, sleep disturbance and suicidal ideas. The patient is nervous/anxious.     Objective:  BP 108/76   Pulse 64   Temp 98.3 F (36.8 C)   Ht 5' 7 (1.702 m)   Wt 150 lb 3.2 oz (68.1 kg)   SpO2 99%   BMI 23.52 kg/m   BP Readings from Last 3 Encounters:  09/21/24 108/76  05/19/24 110/70  02/18/24 112/68    Wt Readings from Last 3 Encounters:  09/21/24 150 lb 3.2 oz (68.1 kg)  05/19/24 148 lb (67.1 kg)  02/18/24 148 lb (67.1 kg)    Physical Exam Constitutional:      General: He is not in acute distress.    Appearance: Normal appearance. He is well-developed.     Comments: NAD  Eyes:     Conjunctiva/sclera: Conjunctivae normal.     Pupils: Pupils are equal, round, and reactive to light.  Neck:     Thyroid : No thyromegaly.     Vascular: No JVD.  Cardiovascular:     Rate and Rhythm: Normal rate and regular rhythm.     Heart sounds: Normal heart sounds. No murmur heard.    No friction rub. No gallop.  Pulmonary:     Effort: Pulmonary effort is normal. No respiratory distress.     Breath sounds: Normal breath  sounds. No wheezing or rales.  Chest:     Chest wall: No tenderness.  Abdominal:     General: Bowel sounds are normal. There is no distension.     Palpations: Abdomen is soft. There is no mass.     Tenderness: There is no abdominal tenderness. There is no guarding or rebound.  Musculoskeletal:        General: No tenderness. Normal range of motion.     Cervical back: Normal range of motion.  Lymphadenopathy:     Cervical: No cervical adenopathy.  Skin:    General: Skin is warm and dry.     Findings: No rash.  Neurological:     Mental Status: He is alert and oriented to person, place, and time.     Cranial Nerves: No cranial nerve  deficit.     Motor: No abnormal muscle tone.     Coordination: Coordination normal.     Gait: Gait normal.     Deep Tendon Reflexes: Reflexes are normal and symmetric.  Psychiatric:        Behavior: Behavior normal.        Thought Content: Thought content normal.        Judgment: Judgment normal.   R axilla seb cyst  Lab Results  Component Value Date   WBC 3.6 (L) 09/17/2023   HGB 13.7 09/17/2023   HCT 42.5 09/17/2023   PLT 223.0 09/17/2023   GLUCOSE 82 09/17/2023   CHOL 163 09/17/2023   TRIG 179.0 (H) 09/17/2023   HDL 77.80 09/17/2023   LDLCALC 49 09/17/2023   ALT 15 09/17/2023   AST 19 09/17/2023   NA 139 09/17/2023   K 4.3 09/17/2023   CL 102 09/17/2023   CREATININE 0.87 09/17/2023   BUN 11 09/17/2023   CO2 30 09/17/2023   TSH 2.15 09/17/2023   PSA 1.13 09/17/2023   HGBA1C 4.7 05/21/2018    CT CARDIAC SCORING ([facility] LOCATIONS ONLY) Result Date: 08/18/2023 CLINICAL DATA:  Dyslipidemia * Tracking Code: FCC * EXAM: CT CARDIAC CORONARY ARTERY CALCIUM SCORE TECHNIQUE: Non-contrast imaging through the heart was performed using prospective ECG gating. Image post processing was performed on an independent workstation, allowing for quantitative analysis of the heart and coronary arteries. Note that this exam targets the heart and the chest was not imaged in its entirety. COMPARISON:  None available. FINDINGS: CORONARY CALCIUM SCORES: Left Main: 0 LAD: 0 LCx: 0 RCA/PDA: 0 Total Agatston Score: 0 MESA database percentile: 0 AORTA MEASUREMENTS: Ascending Aorta: 3.6 cm Descending Aorta:2.5 cm OTHER FINDINGS: Heart is normal size. Aorta normal caliber. No adenopathy. No confluent airspace opacities or effusions. No acute findings in the upper abdomen. Chest wall soft tissues are unremarkable. No acute bony abnormality. IMPRESSION: No visible coronary artery calcifications. Total coronary calcium score of 0. No acute or significant extracardiac abnormality. Electronically Signed   By: Franky Crease M.D.   On: 08/18/2023 11:42    Assessment & Plan:   Problem List Items Addressed This Visit     Abnormal CBC   Chronic Watching CBC      Adenopathy   Occasional L under the jaw - ?tooth crown needs work (L lower molar) Amoxicillin  prn Sch dental appt      Adenopathy, cervical - Primary   Occasional L under the jaw - ?tooth crown needs work (L lower molar) Amoxicillin  prn Sch dental appt      B12 deficiency   On B12 po      PTSD (post-traumatic  stress disorder)   Anxiety, poor sleep - Start seeing a psychologist at the [facility]      Sebaceous cyst of axilla   Schedule procedure w/me         Meds ordered this encounter  Medications   amoxicillin  (AMOXIL ) 875 MG tablet    Sig: Take 1 tablet (875 mg total) by mouth 2 (two) times daily for 10 days.    Dispense:  20 tablet    Refill:  0      Follow-up: Return for a follow-up visit.  Marolyn Noel, MD

## 2024-09-21 NOTE — Assessment & Plan Note (Addendum)
 Schedule procedure w/me

## 2024-09-25 ENCOUNTER — Other Ambulatory Visit (HOSPITAL_COMMUNITY)
Admission: RE | Admit: 2024-09-25 | Discharge: 2024-09-25 | Disposition: A | Discharge status: Home / Self Care | Source: Ambulatory Visit | Attending: Internal Medicine | Admitting: Internal Medicine

## 2024-09-25 ENCOUNTER — Ambulatory Visit: Admitting: Internal Medicine

## 2024-09-25 ENCOUNTER — Encounter: Payer: Self-pay | Admitting: Internal Medicine

## 2024-09-25 VITALS — Ht 67.0 in

## 2024-09-25 DIAGNOSIS — L723 Sebaceous cyst: Secondary | ICD-10-CM | POA: Insufficient documentation

## 2024-09-25 DIAGNOSIS — L720 Epidermal cyst: Secondary | ICD-10-CM | POA: Diagnosis not present

## 2024-09-25 MED ORDER — CEPHALEXIN 500 MG PO CAPS
1000.0000 mg | ORAL_CAPSULE | Freq: Two times a day (BID) | ORAL | 0 refills | Status: AC
Start: 2024-09-25 — End: ?

## 2024-09-25 NOTE — Progress Notes (Addendum)
 Subjective:  Patient ID: George [name], male    DOB: July 16, 1966  Age: 58 y.o. MRN: [id number]  CC: Cyst (Cyst removal)   HPI George [name] presents for cyst removal  Outpatient Medications Prior to Visit  Medication Sig Dispense Refill   acyclovir  cream (ZOVIRAX ) 5 % Apply 1 Application topically every 3 (three) hours. 15 g 3   amoxicillin  (AMOXIL ) 875 MG tablet Take 1 tablet (875 mg total) by mouth 2 (two) times daily for 10 days. 20 tablet 0   Cholecalciferol (VITAMIN D3) 50 MCG (2000 UT) capsule Take 1 capsule (2,000 Units total) by mouth daily. 100 capsule 3   ciclopirox  (PENLAC ) 8 % solution Apply topically at bedtime. Apply over nail and surrounding skin daily 6.6 mL 1   clotrimazole -betamethasone  (LOTRISONE ) cream Apply 1 Application topically daily. For under arms and groin rash 90 g 3   Cyanocobalamin  (VITAMIN B-12) 1000 MCG SUBL Place 1 tablet (1,000 mcg total) under the tongue daily. 100 tablet 3   tadalafil  (CIALIS ) 5 MG tablet Take 1 tablet (5 mg total) by mouth daily. 90 tablet 3   valACYclovir  (VALTREX ) 500 MG tablet Take 1 tablet (500 mg total) by mouth 2 (two) times daily. 14 tablet 3   Vitamin D , Ergocalciferol , (DRISDOL ) 1.25 MG (50000 UNIT) CAPS capsule Take 50,000 Units by mouth daily at 2 am.     zolpidem  (AMBIEN ) 10 MG tablet Take 0.5-1 tablets (5-10 mg total) by mouth at bedtime as needed for sleep. 30 tablet 2   No facility-administered medications prior to visit.    ROS: Review of Systems  Objective:  Ht 5' 7 (1.702 m)   BMI 23.52 kg/m   BP Readings from Last 3 Encounters:  09/21/24 108/76  05/19/24 110/70  02/18/24 112/68    Wt Readings from Last 3 Encounters:  09/21/24 150 lb 3.2 oz (68.1 kg)  05/19/24 148 lb (67.1 kg)  02/18/24 148 lb (67.1 kg)    Physical Exam    Procedure: cyst/ tumor excision  Indication - subcutaneous cyst/tumor  Risks discussed, including but not limited to bleeding, infection, scar, etc Verbal consent  obtained.  Lesion #1 in the R axilla measuring 11x9 mm     Skin over lesion #1  was prepped with Betadine and alcohol  and anesthetized with 7 cc of 2% lidocaine  and epinephrine, using a 25-gauge 1.5 inch needle. Excisional biopsy with a sterile #11 blade was carried out in the usual fashion - 1 cm incision. Grey content plus capsule was excised. Wound was irrigated. Then it was closed with dermaglue. A Band-Aid was applied without antibiotic ointment.   Wound care instructions were provided. Keflex Rx sent if wound gets infected   Lab Results  Component Value Date   WBC 3.6 (L) 09/17/2023   HGB 13.7 09/17/2023   HCT 42.5 09/17/2023   PLT 223.0 09/17/2023   GLUCOSE 82 09/17/2023   CHOL 163 09/17/2023   TRIG 179.0 (H) 09/17/2023   HDL 77.80 09/17/2023   LDLCALC 49 09/17/2023   ALT 15 09/17/2023   AST 19 09/17/2023   NA 139 09/17/2023   K 4.3 09/17/2023   CL 102 09/17/2023   CREATININE 0.87 09/17/2023   BUN 11 09/17/2023   CO2 30 09/17/2023   TSH 2.15 09/17/2023   PSA 1.13 09/17/2023   HGBA1C 4.7 05/21/2018    CT CARDIAC SCORING ([facility] LOCATIONS ONLY) Result Date: 08/18/2023 CLINICAL DATA:  Dyslipidemia * Tracking Code: FCC * EXAM: CT CARDIAC CORONARY ARTERY CALCIUM SCORE TECHNIQUE:  Non-contrast imaging through the heart was performed using prospective ECG gating. Image post processing was performed on an independent workstation, allowing for quantitative analysis of the heart and coronary arteries. Note that this exam targets the heart and the chest was not imaged in its entirety. COMPARISON:  None available. FINDINGS: CORONARY CALCIUM SCORES: Left Main: 0 LAD: 0 LCx: 0 RCA/PDA: 0 Total Agatston Score: 0 MESA database percentile: 0 AORTA MEASUREMENTS: Ascending Aorta: 3.6 cm Descending Aorta:2.5 cm OTHER FINDINGS: Heart is normal size. Aorta normal caliber. No adenopathy. No confluent airspace opacities or effusions. No acute findings in the upper abdomen. Chest wall soft tissues are  unremarkable. No acute bony abnormality. IMPRESSION: No visible coronary artery calcifications. Total coronary calcium score of 0. No acute or significant extracardiac abnormality. Electronically Signed   By: Franky Crease M.D.   On: 08/18/2023 11:42    Assessment & Plan:   Problem List Items Addressed This Visit     Sebaceous cyst of axilla   Painful at times. See procedure. The patient has a physical job.  Keflex was prescribed in case infectious inflammation develops       Other Visit Diagnoses       Sebaceous cyst of right axilla    -  Primary   Relevant Orders   Surgical pathology         Meds ordered this encounter  Medications   cephALEXin (KEFLEX) 500 MG capsule    Sig: Take 2 capsules (1,000 mg total) by mouth 2 (two) times daily.    Dispense:  20 capsule    Refill:  0      Follow-up: Return in about 4 months (around 01/23/2025) for a follow-up visit.  George Noel, MD

## 2024-09-25 NOTE — Assessment & Plan Note (Addendum)
 Painful at times. See procedure. The patient has a physical job.  Keflex was prescribed in case infectious inflammation develops

## 2024-09-28 LAB — SURGICAL PATHOLOGY

## 2024-09-29 ENCOUNTER — Ambulatory Visit: Payer: Self-pay | Admitting: Internal Medicine

## 2025-01-25 ENCOUNTER — Ambulatory Visit: Admitting: Internal Medicine
